# Patient Record
Sex: Female | Born: 1960
Health system: Southern US, Community
[De-identification: ages and names within clinical notes are randomized; demographics above are authoritative.]

## PROBLEM LIST (undated history)

## (undated) DIAGNOSIS — E669 Obesity, unspecified: Secondary | ICD-10-CM

## (undated) DIAGNOSIS — M7730 Calcaneal spur, unspecified foot: Secondary | ICD-10-CM

## (undated) DIAGNOSIS — E785 Hyperlipidemia, unspecified: Secondary | ICD-10-CM

## (undated) DIAGNOSIS — M7661 Achilles tendinitis, right leg: Secondary | ICD-10-CM

## (undated) DIAGNOSIS — I10 Essential (primary) hypertension: Secondary | ICD-10-CM

## (undated) DIAGNOSIS — K5790 Diverticulosis of intestine, part unspecified, without perforation or abscess without bleeding: Secondary | ICD-10-CM

## (undated) DIAGNOSIS — F17200 Nicotine dependence, unspecified, uncomplicated: Secondary | ICD-10-CM

## (undated) HISTORY — PX: CHOLECYSTECTOMY: SHX55

## (undated) HISTORY — PX: ABDOMINAL HYSTERECTOMY: SHX81

## (undated) HISTORY — DX: Essential (primary) hypertension: I10

## (undated) HISTORY — PX: BREAST BIOPSY: SHX20

---

## 2000-02-24 ENCOUNTER — Emergency Department (HOSPITAL_COMMUNITY): Admission: EM | Admit: 2000-02-24 | Discharge: 2000-02-25 | Payer: Self-pay | Admitting: Emergency Medicine

## 2000-02-25 ENCOUNTER — Encounter: Payer: Self-pay | Admitting: Emergency Medicine

## 2013-04-08 ENCOUNTER — Ambulatory Visit (INDEPENDENT_AMBULATORY_CARE_PROVIDER_SITE_OTHER): Payer: BC Managed Care – PPO

## 2013-04-08 ENCOUNTER — Encounter: Payer: Self-pay | Admitting: Podiatry

## 2013-04-08 ENCOUNTER — Ambulatory Visit (INDEPENDENT_AMBULATORY_CARE_PROVIDER_SITE_OTHER): Payer: BC Managed Care – PPO | Admitting: Podiatry

## 2013-04-08 VITALS — BP 123/80 | HR 68 | Resp 20 | Ht 68.0 in | Wt 230.0 lb

## 2013-04-08 DIAGNOSIS — R52 Pain, unspecified: Secondary | ICD-10-CM

## 2013-04-08 DIAGNOSIS — M7660 Achilles tendinitis, unspecified leg: Secondary | ICD-10-CM

## 2013-04-08 DIAGNOSIS — M722 Plantar fascial fibromatosis: Secondary | ICD-10-CM

## 2013-04-08 MED ORDER — METHYLPREDNISOLONE (PAK) 4 MG PO TABS: ORAL_TABLET | ORAL | Status: DC

## 2013-04-08 MED ORDER — MELOXICAM 15 MG PO TABS: 15.0000 mg | ORAL_TABLET | Freq: Every day | ORAL | Status: DC

## 2013-04-08 NOTE — Progress Notes (Signed)
   Subjective:    Patient ID: Ashley Bowers, female    DOB: 06-Jul-1960, 52 y.o.   MRN: [id number] "It's both feet really.  My right foot hurts on back of the heel.  My left foot swells on the top."  HPI Comments: N  Swelling, sharp pain sometime, aches L  Dorsal pain and swelling left D  August  O  Suddenly after getting cortisone shots C  Gotten worse A  Walking, standing, shoes T  [facility] took some xrays   Foot Pain This is a new (Posterior heel pain Rt.) problem. Episode onset: 1 year. The problem occurs daily. The problem has been gradually worsening. The symptoms are aggravated by walking and standing (get up in the morning). She has tried heat and NSAIDs (foot soaks, take Advil) for the symptoms. The treatment provided no relief.      Review of Systems     Objective:   Physical Exam: I have reviewed her past medical history medications and allergies as well as her family history social history and review of systems. Vital signs are stable she is alert and oriented x3. Lower extremity evaluation demonstrates strong palpable pulses bilateral +2/4 DP PT bilateral. Capillary fill time to digits one through 5 of the bilateral foot is noted to be immediate. Neurologic sensorium is intact per Semmes-Weinstein monofilament bilateral. Deep tendon reflexes are intact and brisk bilateral. Orthopedic evaluation demonstrates all joints distal to the ankle have a full range of motion without crepitation. She has no pain on palpation of the medial calcaneal tubercle right heel however of the left heel does demonstrate pain on palpation of the medial calcaneal tubercle. Neither calcaneus hurts with medial lateral compression. Cutaneous evaluation demonstrates supple well hydrated cutis no erythema edema cellulitis drainage or odor. Radiographic evaluation demonstrates a large retrocalcaneal heel spur right with soft tissue increase in density of the tendo Achilles as it inserts on the  posterior aspect of the calcaneus. Left foot demonstrates normal soft tissue margins with exception of a soft tissue increase in density at the plantar fascial calcaneal insertion site.        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 Plantar fasciitis left.  Plan: We discussed the etiology pathology conservative versus surgical therapies. I injected 2 mg of dexamethasone subcutaneously to her right posterior heel. She was then dispensed a Cam Walker for that foot. The left foot was injected at the plantar fascial calcaneal insertion site with Kenalog and local anesthetic. Plantar fascial strapping was applied and a night splint was dispensed. A prescription for Medrol Dosepak to be followed by Mobic was dispensed. We discussed in great detail the appropriate shoe gear stretching exercises and ice therapy. She was dispensed both oral and written home-going instructions for the care of Achilles tendinitis and plantar fasciitis. I will followup with her in one month.

## 2013-04-08 NOTE — Patient Instructions (Signed)
Achilles Tendinitis  with Rehab Achilles tendinitis is a disorder of the Achilles tendon. The Achilles tendon connects the large calf muscles (Gastrocnemius and Soleus) to the heel bone (calcaneus). This tendon is sometimes called the heel cord. It is important for pushing-off and standing on your toes and is important for walking, running, or jumping. Tendinitis is often caused by overuse and repetitive microtrauma. SYMPTOMS  Pain, tenderness, swelling, warmth, and redness may occur over the Achilles tendon even at rest.  Pain with pushing off, or flexing or extending the ankle.  Pain that is worsened after or during activity. CAUSES   Overuse sometimes seen with rapid increase in exercise programs or in sports requiring running and jumping.  Poor physical conditioning (strength and flexibility or endurance).  Running sports, especially training running down hills.  Inadequate warm-up before practice or play or failure to stretch before participation.  Injury to the tendon. PREVENTION   Warm up and stretch before practice or competition.  Allow time for adequate rest and recovery between practices and competition.  Keep up conditioning.  Keep up ankle and leg flexibility.  Improve or keep muscle strength and endurance.  Improve cardiovascular fitness.  Use proper technique.  Use proper equipment (shoes, skates).  To help prevent recurrence, taping, protective strapping, or an adhesive bandage may be recommended for several weeks after healing is complete. PROGNOSIS   Recovery may take weeks to several months to heal.  Longer recovery is expected if symptoms have been prolonged.  Recovery is usually quicker if the inflammation is due to a direct blow as compared with overuse or sudden strain. RELATED COMPLICATIONS   Healing time will be prolonged if the condition is not correctly treated. The injury must be given plenty of time to heal.  Symptoms can reoccur if  activity is resumed too soon.  Untreated, tendinitis may increase the risk of tendon rupture requiring additional time for recovery and possibly surgery. TREATMENT   The first treatment consists of rest anti-inflammatory medication, and ice to relieve the pain.  Stretching and strengthening exercises after resolution of pain will likely help reduce the risk of recurrence. Referral to a physical therapist or athletic trainer for further evaluation and treatment may be helpful.  A walking boot or cast may be recommended to rest the Achilles tendon. This can help break the cycle of inflammation and microtrauma.  Arch supports (orthotics) may be prescribed or recommended by your caregiver as an adjunct to therapy and rest.  Surgery to remove the inflamed tendon lining or degenerated tendon tissue is rarely necessary and has shown less than predictable results. MEDICATION   Nonsteroidal anti-inflammatory medications, such as aspirin and ibuprofen, may be used for pain and inflammation relief. Do not take within 7 days before surgery. Take these as directed by your caregiver. Contact your caregiver immediately if any bleeding, stomach upset, or signs of allergic reaction occur. Other minor pain relievers, such as acetaminophen, may also be used.  Pain relievers may be prescribed as necessary by your caregiver. Do not take prescription pain medication for longer than 4 to 7 days. Use only as directed and only as much as you need.  Cortisone injections are rarely indicated. Cortisone injections may weaken tendons and predispose to rupture. It is better to give the condition more time to heal than to use them. HEAT AND COLD  Cold is used to relieve pain and reduce inflammation for acute and chronic Achilles tendinitis. Cold should be applied for 10 to 15 minutes   every 2 to 3 hours for inflammation and pain and immediately after any activity that aggravates your symptoms. Use ice packs or an ice  massage.  Heat may be used before performing stretching and strengthening activities prescribed by your caregiver. Use a heat pack or a warm soak. SEEK MEDICAL CARE IF:  Symptoms get worse or do not improve in 2 weeks despite treatment.  New, unexplained symptoms develop. Drugs used in treatment may produce side effects. EXERCISES RANGE OF MOTION (ROM) AND STRETCHING EXERCISES - Achilles Tendinitis  These exercises may help you when beginning to rehabilitate your injury. Your symptoms may resolve with or without further involvement from your physician, physical therapist or athletic trainer. While completing these exercises, remember:   Restoring tissue flexibility helps normal motion to return to the joints. This allows healthier, less painful movement and activity.  An effective stretch should be held for at least 30 seconds.  A stretch should never be painful. You should only feel a gentle lengthening or release in the stretched tissue. STRETCH  Gastroc, Standing   Place hands on wall.  Extend right / left leg, keeping the front knee somewhat bent.  Slightly point your toes inward on your back foot.  Keeping your right / left heel on the floor and your knee straight, shift your weight toward the wall, not allowing your back to arch.  You should feel a gentle stretch in the right / left calf. Hold this position for __________ seconds. Repeat __________ times. Complete this stretch __________ times per day. STRETCH  Soleus, Standing   Place hands on wall.  Extend right / left leg, keeping the other knee somewhat bent.  Slightly point your toes inward on your back foot.  Keep your right / left heel on the floor, bend your back knee, and slightly shift your weight over the back leg so that you feel a gentle stretch deep in your back calf.  Hold this position for __________ seconds. Repeat __________ times. Complete this stretch __________ times per day. STRETCH  Gastrocsoleus,  Standing  Note: This exercise can place a lot of stress on your foot and ankle. Please complete this exercise only if specifically instructed by your caregiver.   Place the ball of your right / left foot on a step, keeping your other foot firmly on the same step.  Hold on to the wall or a rail for balance.  Slowly lift your other foot, allowing your body weight to press your heel down over the edge of the step.  You should feel a stretch in your right / left calf.  Hold this position for __________ seconds.  Repeat this exercise with a slight bend in your knee. Repeat __________ times. Complete this stretch __________ times per day.  STRENGTHENING EXERCISES - Achilles Tendinitis These exercises may help you when beginning to rehabilitate your injury. They may resolve your symptoms with or without further involvement from your physician, physical therapist or athletic trainer. While completing these exercises, remember:   Muscles can gain both the endurance and the strength needed for everyday activities through controlled exercises.  Complete these exercises as instructed by your physician, physical therapist or athletic trainer. Progress the resistance and repetitions only as guided.  You may experience muscle soreness or fatigue, but the pain or discomfort you are trying to eliminate should never worsen during these exercises. If this pain does worsen, stop and make certain you are following the directions exactly. If the pain is still present after   adjustments, discontinue the exercise until you can discuss the trouble with your clinician. STRENGTH - Plantar-flexors   Sit with your right / left leg extended. Holding onto both ends of a rubber exercise band/tubing, loop it around the ball of your foot. Keep a slight tension in the band.  Slowly push your toes away from you, pointing them downward.  Hold this position for __________ seconds. Return slowly, controlling the tension in the  band/tubing. Repeat __________ times. Complete this exercise __________ times per day.  STRENGTH - Plantar-flexors   Stand with your feet shoulder width apart. Steady yourself with a wall or table using as little support as needed.  Keeping your weight evenly spread over the width of your feet, rise up on your toes.*  Hold this position for __________ seconds. Repeat __________ times. Complete this exercise __________ times per day.  *If this is too easy, shift your weight toward your right / left leg until you feel challenged. Ultimately, you may be asked to do this exercise with your right / left foot only. STRENGTH  Plantar-flexors, Eccentric  Note: This exercise can place a lot of stress on your foot and ankle. Please complete this exercise only if specifically instructed by your caregiver.   Place the balls of your feet on a step. With your hands, use only enough support from a wall or rail to keep your balance.  Keep your knees straight and rise up on your toes.  Slowly shift your weight entirely to your right / left toes and pick up your opposite foot. Gently and with controlled movement, lower your weight through your right / left foot so that your heel drops below the level of the step. You will feel a slight stretch in the back of your calf at the end position.  Use the healthy leg to help rise up onto the balls of both feet, then lower weight only on the right / left leg again. Build up to 15 repetitions. Then progress to 3 consecutive sets of 15 repetitions.*  After completing the above exercise, complete the same exercise with a slight knee bend (about 30 degrees). Again, build up to 15 repetitions. Then progress to 3 consecutive sets of 15 repetitions.* Perform this exercise __________ times per day.  *When you easily complete 3 sets of 15, your physician, physical therapist or athletic trainer may advise you to add resistance by wearing a backpack filled with additional  weight. STRENGTH - Plantar Flexors, Seated   Sit on a chair that allows your feet to rest flat on the ground. If necessary, sit at the edge of the chair.  Keeping your toes firmly on the ground, lift your right / left heel as far as you can without increasing any discomfort in your ankle. Repeat __________ times. Complete this exercise __________ times a day. *If instructed by your physician, physical therapist or athletic trainer, you may add ____________________ of resistance by placing a weighted object on your right / left knee. Document Released: 11/21/2004 Document Revised: 07/15/2011 Document Reviewed: 08/04/2008 [employer] Patient Information 2014 [employer]. Achilles Tendinitis Tendinitis a swelling and soreness of the tendon. The pain in the tendon (cord-like structure which attaches muscle to bone) is produced by tiny tears and the inflammation present in that tendon. It commonly occurs at the shoulders, heels, and elbows. It is usually caused by overusing the tendon and joint involved. Achilles tendinitis involves the Achilles tendon. This is the large tendon in the back of the leg just   above the foot. It attaches the large muscles of the lower leg to the heel bone (called calcaneus).  This diagnosis (learning what is wrong) is made by examination. X-rays will be generally be normal if only tendinitis is present. HOME CARE INSTRUCTIONS   Apply ice to the injury for 15-20 minutes, 03-04 times per day. Put the ice in a plastic bag and place a towel between the bag of ice and your skin.  Try to avoid use other than gentle range of motion while the tendon is painful. Do not resume use until instructed by your caregiver. Then begin use gradually. Do not increase use to the point of pain. If pain does develop, decrease use and continue the above measures. Gradually increase activities that do not cause discomfort until you gradually achieve normal use.  Only take over-the-counter or  prescription medicines for pain, discomfort, or fever as directed by your caregiver. SEEK MEDICAL CARE IF:   Your pain and swelling increase or pain is uncontrolled with medications.  You develop new, unexplained problems (symptoms) or an increase of the symptoms that brought you to your caregiver.  You develop an inability to move your toes or foot, develop warmth and swelling in your foot, or begin running an unexplained temperature. MAKE SURE YOU:   Understand these instructions.  Will watch your condition.  Will get help right away if you are not doing well or get worse. Document Released: 01/30/2005 Document Revised: 07/15/2011 Document Reviewed: 12/02/2012 [employer] Patient Information 2014 [employer]. Plantar Fasciitis (Heel Spur Syndrome) with Rehab The plantar fascia is a fibrous, ligament-like, soft-tissue structure that spans the bottom of the foot. Plantar fasciitis is a condition that causes pain in the foot due to inflammation of the tissue. SYMPTOMS   Pain and tenderness on the underneath side of the foot.  Pain that worsens with standing or walking. CAUSES  Plantar fasciitis is caused by irritation and injury to the plantar fascia on the underneath side of the foot. Common mechanisms of injury include:  Direct trauma to bottom of the foot.  Damage to a small nerve that runs under the foot where the main fascia attaches to the heel bone.  Stress placed on the plantar fascia due to bone spurs. RISK INCREASES WITH:   Activities that place stress on the plantar fascia (running, jumping, pivoting, or cutting).  Poor strength and flexibility.  Improperly fitted shoes.  Tight calf muscles.  Flat feet.  Failure to warm-up properly before activity.  Obesity. PREVENTION  Warm up and stretch properly before activity.  Allow for adequate recovery between workouts.  Maintain physical fitness:  Strength, flexibility, and endurance.  Cardiovascular  fitness.  Maintain a health body weight.  Avoid stress on the plantar fascia.  Wear properly fitted shoes, including arch supports for individuals who have flat feet. PROGNOSIS  If treated properly, then the symptoms of plantar fasciitis usually resolve without surgery. However, occasionally surgery is necessary. RELATED COMPLICATIONS   Recurrent symptoms that may result in a chronic condition.  Problems of the lower back that are caused by compensating for the injury, such as limping.  Pain or weakness of the foot during push-off following surgery.  Chronic inflammation, scarring, and partial or complete fascia tear, occurring more often from repeated injections. TREATMENT  Treatment initially involves the use of ice and medication to help reduce pain and inflammation. The use of strengthening and stretching exercises may help reduce pain with activity, especially stretches of the Achilles tendon. These exercises   may be performed at home or with a therapist. Your caregiver may recommend that you use heel cups of arch supports to help reduce stress on the plantar fascia. Occasionally, corticosteroid injections are given to reduce inflammation. If symptoms persist for greater than 6 months despite non-surgical (conservative), then surgery may be recommended.  MEDICATION   If pain medication is necessary, then nonsteroidal anti-inflammatory medications, such as aspirin and ibuprofen, or other minor pain relievers, such as acetaminophen, are often recommended.  Do not take pain medication within 7 days before surgery.  Prescription pain relievers may be given if deemed necessary by your caregiver. Use only as directed and only as much as you need.  Corticosteroid injections may be given by your caregiver. These injections should be reserved for the most serious cases, because they may only be given a certain number of times. HEAT AND COLD  Cold treatment (icing) relieves pain and reduces  inflammation. Cold treatment should be applied for 10 to 15 minutes every 2 to 3 hours for inflammation and pain and immediately after any activity that aggravates your symptoms. Use ice packs or massage the area with a piece of ice (ice massage).  Heat treatment may be used prior to performing the stretching and strengthening activities prescribed by your caregiver, physical therapist, or athletic trainer. Use a heat pack or soak the injury in warm water. SEEK IMMEDIATE MEDICAL CARE IF:  Treatment seems to offer no benefit, or the condition worsens.  Any medications produce adverse side effects. EXERCISES RANGE OF MOTION (ROM) AND STRETCHING EXERCISES - Plantar Fasciitis (Heel Spur Syndrome) These exercises may help you when beginning to rehabilitate your injury. Your symptoms may resolve with or without further involvement from your physician, physical therapist or athletic trainer. While completing these exercises, remember:   Restoring tissue flexibility helps normal motion to return to the joints. This allows healthier, less painful movement and activity.  An effective stretch should be held for at least 30 seconds.  A stretch should never be painful. You should only feel a gentle lengthening or release in the stretched tissue. RANGE OF MOTION - Toe Extension, Flexion  Sit with your right / left leg crossed over your opposite knee.  Grasp your toes and gently pull them back toward the top of your foot. You should feel a stretch on the bottom of your toes and/or foot.  Hold this stretch for __________ seconds.  Now, gently pull your toes toward the bottom of your foot. You should feel a stretch on the top of your toes and or foot.  Hold this stretch for __________ seconds. Repeat __________ times. Complete this stretch __________ times per day.  RANGE OF MOTION - Ankle Dorsiflexion, Active Assisted  Remove shoes and sit on a chair that is preferably not on a carpeted  surface.  Place right / left foot under knee. Extend your opposite leg for support.  Keeping your heel down, slide your right / left foot back toward the chair until you feel a stretch at your ankle or calf. If you do not feel a stretch, slide your bottom forward to the edge of the chair, while still keeping your heel down.  Hold this stretch for __________ seconds. Repeat __________ times. Complete this stretch __________ times per day.  STRETCH  Gastroc, Standing  Place hands on wall.  Extend right / left leg, keeping the front knee somewhat bent.  Slightly point your toes inward on your back foot.  Keeping your right / left   heel on the floor and your knee straight, shift your weight toward the wall, not allowing your back to arch.  You should feel a gentle stretch in the right / left calf. Hold this position for __________ seconds. Repeat __________ times. Complete this stretch __________ times per day. STRETCH  Soleus, Standing  Place hands on wall.  Extend right / left leg, keeping the other knee somewhat bent.  Slightly point your toes inward on your back foot.  Keep your right / left heel on the floor, bend your back knee, and slightly shift your weight over the back leg so that you feel a gentle stretch deep in your back calf.  Hold this position for __________ seconds. Repeat __________ times. Complete this stretch __________ times per day. STRETCH  Gastrocsoleus, Standing  Note: This exercise can place a lot of stress on your foot and ankle. Please complete this exercise only if specifically instructed by your caregiver.   Place the ball of your right / left foot on a step, keeping your other foot firmly on the same step.  Hold on to the wall or a rail for balance.  Slowly lift your other foot, allowing your body weight to press your heel down over the edge of the step.  You should feel a stretch in your right / left calf.  Hold this position for __________  seconds.  Repeat this exercise with a slight bend in your right / left knee. Repeat __________ times. Complete this stretch __________ times per day.  STRENGTHENING EXERCISES - Plantar Fasciitis (Heel Spur Syndrome)  These exercises may help you when beginning to rehabilitate your injury. They may resolve your symptoms with or without further involvement from your physician, physical therapist or athletic trainer. While completing these exercises, remember:   Muscles can gain both the endurance and the strength needed for everyday activities through controlled exercises.  Complete these exercises as instructed by your physician, physical therapist or athletic trainer. Progress the resistance and repetitions only as guided. STRENGTH - Towel Curls  Sit in a chair positioned on a non-carpeted surface.  Place your foot on a towel, keeping your heel on the floor.  Pull the towel toward your heel by only curling your toes. Keep your heel on the floor.  If instructed by your physician, physical therapist or athletic trainer, add ____________________ at the end of the towel. Repeat __________ times. Complete this exercise __________ times per day. STRENGTH - Ankle Inversion  Secure one end of a rubber exercise band/tubing to a fixed object (table, pole). Loop the other end around your foot just before your toes.  Place your fists between your knees. This will focus your strengthening at your ankle.  Slowly, pull your big toe up and in, making sure the band/tubing is positioned to resist the entire motion.  Hold this position for __________ seconds.  Have your muscles resist the band/tubing as it slowly pulls your foot back to the starting position. Repeat __________ times. Complete this exercises __________ times per day.  Document Released: 04/22/2005 Document Revised: 07/15/2011 Document Reviewed: 08/04/2008 [employer] Patient Information 2014 [employer]. Plantar Fasciitis Plantar  fasciitis is a common condition that causes foot pain. It is soreness (inflammation) of the band of tough fibrous tissue on the bottom of the foot that runs from the heel bone (calcaneus) to the ball of the foot. The cause of this soreness may be from excessive standing, poor fitting shoes, running on hard surfaces, being overweight, having an abnormal   walk, or overuse (this is common in runners) of the painful foot or feet. It is also common in aerobic exercise dancers and ballet dancers. SYMPTOMS  Most people with plantar fasciitis complain of:  Severe pain in the morning on the bottom of their foot especially when taking the first steps out of bed. This pain recedes after a few minutes of walking.  Severe pain is experienced also during walking following a long period of inactivity.  Pain is worse when walking barefoot or up stairs DIAGNOSIS   Your caregiver will diagnose this condition by examining and feeling your foot.  Special tests such as X-rays of your foot, are usually not needed. PREVENTION   Consult a sports medicine professional before beginning a new exercise program.  Walking programs offer a good workout. With walking there is a lower chance of overuse injuries common to runners. There is less impact and less jarring of the joints.  Begin all new exercise programs slowly. If problems or pain develop, decrease the amount of time or distance until you are at a comfortable level.  Wear good shoes and replace them regularly.  Stretch your foot and the heel cords at the back of the ankle (Achilles tendon) both before and after exercise.  Run or exercise on even surfaces that are not hard. For example, asphalt is better than pavement.  Do not run barefoot on hard surfaces.  If using a treadmill, vary the incline.  Do not continue to workout if you have foot or joint problems. Seek professional help if they do not improve. HOME CARE INSTRUCTIONS   Avoid activities that  cause you pain until you recover.  Use ice or cold packs on the problem or painful areas after working out.  Only take over-the-counter or prescription medicines for pain, discomfort, or fever as directed by your caregiver.  Soft shoe inserts or athletic shoes with air or gel sole cushions may be helpful.  If problems continue or become more severe, consult a sports medicine caregiver or your own health care provider. Cortisone is a potent anti-inflammatory medication that may be injected into the painful area. You can discuss this treatment with your caregiver. MAKE SURE YOU:   Understand these instructions.  Will watch your condition.  Will get help right away if you are not doing well or get worse. Document Released: 01/15/2001 Document Revised: 07/15/2011 Document Reviewed: 03/16/2008 [employer] Patient Information 2014 [employer].  

## 2013-05-11 ENCOUNTER — Ambulatory Visit (INDEPENDENT_AMBULATORY_CARE_PROVIDER_SITE_OTHER): Payer: BC Managed Care – PPO | Admitting: Podiatry

## 2013-05-11 ENCOUNTER — Encounter: Payer: Self-pay | Admitting: Podiatry

## 2013-05-11 VITALS — BP 130/83 | HR 73 | Resp 18

## 2013-05-11 DIAGNOSIS — M7660 Achilles tendinitis, unspecified leg: Secondary | ICD-10-CM

## 2013-05-11 NOTE — Progress Notes (Signed)
She presents today stating that her plantar fasciitis to the left foot has resolved however she still has some tenderness to the posterior aspect of the tendo Achilles at its insertion site on the right heel. She states that the Cam Walker culture back to be painful and was unable to utilize it. She does wear tennis shoes in the house. She continues her anti-inflammatories and ice.  Objective: Vital signs are stable she is alert and oriented x3. No pain on palpation medial calcaneal tubercle of the left heel as previously noted. She does have tenderness on palpation with a palpable bursitis posterior lateral aspect of the right calcaneus.  Assessment: Well-healing plantar fasciitis left. Bursitis tendo Achilles tendinitis right heel.  Plan: Discussed the etiology pathology conservative versus surgical therapies. At this point I reinjected subcutaneously the superficial bursa with dexamethasone, 2 mg into the bursa. We discussed the possible need for physical therapy after next visit. She will continue all conservative therapies and even sleep with the Cam Walker.

## 2013-06-08 ENCOUNTER — Ambulatory Visit (INDEPENDENT_AMBULATORY_CARE_PROVIDER_SITE_OTHER): Payer: BC Managed Care – PPO | Admitting: Podiatry

## 2013-06-08 ENCOUNTER — Encounter: Payer: Self-pay | Admitting: Podiatry

## 2013-06-08 VITALS — BP 135/86 | HR 78 | Resp 16

## 2013-06-08 DIAGNOSIS — M7730 Calcaneal spur, unspecified foot: Secondary | ICD-10-CM

## 2013-06-08 DIAGNOSIS — M7660 Achilles tendinitis, unspecified leg: Secondary | ICD-10-CM

## 2013-06-08 NOTE — Patient Instructions (Signed)
Pre-Operative Instructions  Congratulations, you have decided to take an important step to improving your quality of life.  You can be assured that the doctors of [facility] will be with you every step of the way.  1. Plan to be at the surgery center/hospital at least 1 (one) hour prior to your scheduled time unless otherwise directed by the surgical center/hospital staff.  You must have a responsible adult accompany you, remain during the surgery and drive you home.  Make sure you have directions to the surgical center/hospital and know how to get there on time. 2. For hospital based surgery you will need to obtain a history and physical form from your family physician within 1 month prior to the date of surgery- we will give you a form for you primary physician.  3. We make every effort to accommodate the date you request for surgery.  There are however, times where surgery dates or times have to be moved.  We will contact you as soon as possible if a change in schedule is required.   4. No Aspirin/Ibuprofen for one week before surgery.  If you are on aspirin, any non-steroidal anti-inflammatory medications (Mobic, Aleve, Ibuprofen) you should stop taking it 7 days prior to your surgery.  You make take Tylenol  For pain prior to surgery.  5. Medications- If you are taking daily heart and blood pressure medications, seizure, reflux, allergy, asthma, anxiety, pain or diabetes medications, make sure the surgery center/hospital is aware before the day of surgery so they may notify you which medications to take or avoid the day of surgery. 6. No food or drink after midnight the night before surgery unless directed otherwise by surgical center/hospital staff. 7. No alcoholic beverages 24 hours prior to surgery.  No smoking 24 hours prior to or 24 hours after surgery. 8. Wear loose pants or shorts- loose enough to fit over bandages, boots, and casts. 9. No slip on shoes, sneakers are best. 10. Bring  your boot with you to the surgery center/hospital.  Also bring crutches or a walker if your physician has prescribed it for you.  If you do not have this equipment, it will be provided for you after surgery. 11. If you have not been contracted by the surgery center/hospital by the day before your surgery, call to confirm the date and time of your surgery. 12. Leave-time from work may vary depending on the type of surgery you have.  Appropriate arrangements should be made prior to surgery with your employer. 13. Prescriptions will be provided immediately following surgery by your doctor.  Have these filled as soon as possible after surgery and take the medication as directed. 14. Remove nail polish on the operative foot. 15. Wash the night before surgery.  The night before surgery wash the foot and leg well with the antibacterial soap provided and water paying special attention to beneath the toenails and in between the toes.  Rinse thoroughly with water and dry well with a towel.  Perform this wash unless told not to do so by your physician.  Enclosed: 1 Ice pack (please put in freezer the night before surgery)   1 Hibiclens skin cleaner   Pre-op Instructions  If you have any questions regarding the instructions, do not hesitate to call our office.  [place]: [address] [place], [place] [address] [phone]  [place]: [address] [place], [place] [address] [phone]  [place]: [address]  [place],  [address] [phone]  Dr. Richard   Tuchman DPM, Dr. Norman Regal DPM Dr. Richard Sikora DPM, Dr. M. Todd Hyatt DPM, Dr. Kathryn Egerton DPM 

## 2013-06-08 NOTE — Progress Notes (Signed)
Ashley Bowers presents today for followup of her retrocalcaneal heel spur and her tendo Achilles tendinitis. She states that I am just ready to have surgery done. I'm tired awaiting and I have gotten no better.  Objective: Vital signs are stable she is alert and oriented x3. She has pain on palpation of the posterior right heel. Pulses are strongly palpable right.  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  Plan: We discussed the etiology pathology conservative versus surgical therapies. At this point we signed all 3 pages of the consent form for a retrocalcaneal heel spur resection and Achilles tendon lysis and endoscopic plantar fasciotomy and and application of a below-knee cast. I answered all the questions regarding these procedures to the best of my ability in layman's terms. She understood this was amenable to it and I will followup with her in the near future for surgery.

## 2013-07-02 ENCOUNTER — Emergency Department (HOSPITAL_COMMUNITY): Payer: BC Managed Care – PPO

## 2013-07-02 ENCOUNTER — Inpatient Hospital Stay (HOSPITAL_COMMUNITY)
Admission: EM | Admit: 2013-07-02 | Discharge: 2013-07-05 | DRG: 287 | Disposition: A | Discharge status: Home / Self Care | Payer: BC Managed Care – PPO | Attending: Internal Medicine | Admitting: Internal Medicine

## 2013-07-02 ENCOUNTER — Encounter: Payer: Self-pay | Admitting: Podiatry

## 2013-07-02 ENCOUNTER — Encounter (HOSPITAL_COMMUNITY): Payer: Self-pay | Admitting: Emergency Medicine

## 2013-07-02 DIAGNOSIS — E785 Hyperlipidemia, unspecified: Secondary | ICD-10-CM | POA: Diagnosis present

## 2013-07-02 DIAGNOSIS — F17200 Nicotine dependence, unspecified, uncomplicated: Secondary | ICD-10-CM | POA: Diagnosis present

## 2013-07-02 DIAGNOSIS — E669 Obesity, unspecified: Secondary | ICD-10-CM | POA: Diagnosis present

## 2013-07-02 DIAGNOSIS — R0789 Other chest pain: Principal | ICD-10-CM | POA: Diagnosis present

## 2013-07-02 DIAGNOSIS — Z538 Procedure and treatment not carried out for other reasons: Secondary | ICD-10-CM

## 2013-07-02 DIAGNOSIS — M7661 Achilles tendinitis, right leg: Secondary | ICD-10-CM

## 2013-07-02 DIAGNOSIS — Z7982 Long term (current) use of aspirin: Secondary | ICD-10-CM

## 2013-07-02 DIAGNOSIS — M7730 Calcaneal spur, unspecified foot: Secondary | ICD-10-CM | POA: Diagnosis present

## 2013-07-02 DIAGNOSIS — R079 Chest pain, unspecified: Secondary | ICD-10-CM

## 2013-07-02 DIAGNOSIS — Z79899 Other long term (current) drug therapy: Secondary | ICD-10-CM

## 2013-07-02 DIAGNOSIS — I10 Essential (primary) hypertension: Secondary | ICD-10-CM | POA: Diagnosis present

## 2013-07-02 DIAGNOSIS — M7660 Achilles tendinitis, unspecified leg: Secondary | ICD-10-CM

## 2013-07-02 DIAGNOSIS — I959 Hypotension, unspecified: Secondary | ICD-10-CM | POA: Diagnosis present

## 2013-07-02 DIAGNOSIS — R9439 Abnormal result of other cardiovascular function study: Secondary | ICD-10-CM

## 2013-07-02 DIAGNOSIS — G4730 Sleep apnea, unspecified: Secondary | ICD-10-CM | POA: Diagnosis present

## 2013-07-02 DIAGNOSIS — I517 Cardiomegaly: Secondary | ICD-10-CM

## 2013-07-02 DIAGNOSIS — M722 Plantar fascial fibromatosis: Secondary | ICD-10-CM

## 2013-07-02 DIAGNOSIS — Z8249 Family history of ischemic heart disease and other diseases of the circulatory system: Secondary | ICD-10-CM

## 2013-07-02 DIAGNOSIS — Q664 Congenital talipes calcaneovalgus, unspecified foot: Secondary | ICD-10-CM

## 2013-07-02 HISTORY — DX: Nicotine dependence, unspecified, uncomplicated: F17.200

## 2013-07-02 HISTORY — DX: Hyperlipidemia, unspecified: E78.5

## 2013-07-02 HISTORY — DX: Diverticulosis of intestine, part unspecified, without perforation or abscess without bleeding: K57.90

## 2013-07-02 HISTORY — DX: Obesity, unspecified: E66.9

## 2013-07-02 HISTORY — DX: Achilles tendinitis, right leg: M76.61

## 2013-07-02 HISTORY — DX: Calcaneal spur, unspecified foot: M77.30

## 2013-07-02 LAB — I-STAT TROPONIN, ED: Troponin i, poc: 0.01 ng/mL (ref 0.00–0.08)

## 2013-07-02 LAB — PROTIME-INR
INR: 0.97 (ref 0.00–1.49)
PROTHROMBIN TIME: 12.7 s (ref 11.6–15.2)

## 2013-07-02 LAB — COMPREHENSIVE METABOLIC PANEL
ALBUMIN: 3.1 g/dL — AB (ref 3.5–5.2)
ALT: 12 U/L (ref 0–35)
AST: 14 U/L (ref 0–37)
Alkaline Phosphatase: 58 U/L (ref 39–117)
BILIRUBIN TOTAL: 0.2 mg/dL — AB (ref 0.3–1.2)
BUN: 14 mg/dL (ref 6–23)
CHLORIDE: 105 meq/L (ref 96–112)
CO2: 22 meq/L (ref 19–32)
Calcium: 8.5 mg/dL (ref 8.4–10.5)
Creatinine, Ser: 0.69 mg/dL (ref 0.50–1.10)
GFR calc Af Amer: >90 mL/min (ref >90)
Glucose, Bld: 99 mg/dL (ref 70–99)
Potassium: 4.2 mEq/L (ref 3.7–5.3)
SODIUM: 138 meq/L (ref 137–147)
Total Protein: 6.1 g/dL (ref 6.0–8.3)

## 2013-07-02 LAB — HEMOGLOBIN A1C
Hgb A1c MFr Bld: 5.5 % (ref <5.7)
Mean Plasma Glucose: 111 mg/dL (ref <117)

## 2013-07-02 LAB — CBC
HCT: 39.3 % (ref 36.0–46.0)
HCT: 38.4 % (ref 36.0–46.0)
Hemoglobin: 13 g/dL (ref 12.0–15.0)
Hemoglobin: 13 g/dL (ref 12.0–15.0)
MCH: 31 pg (ref 26.0–34.0)
MCH: 31.6 pg (ref 26.0–34.0)
MCHC: 33.1 g/dL (ref 30.0–36.0)
MCHC: 33.9 g/dL (ref 30.0–36.0)
MCV: 93.6 fL (ref 78.0–100.0)
MCV: 93.2 fL (ref 78.0–100.0)
PLATELETS: 211 10*3/uL (ref 150–400)
Platelets: 217 10*3/uL (ref 150–400)
RBC: 4.2 MIL/uL (ref 3.87–5.11)
RBC: 4.12 MIL/uL (ref 3.87–5.11)
RDW: 13.8 % (ref 11.5–15.5)
RDW: 13.8 % (ref 11.5–15.5)
WBC: 13 10*3/uL — AB (ref 4.0–10.5)
WBC: 11.6 10*3/uL — ABNORMAL HIGH (ref 4.0–10.5)

## 2013-07-02 LAB — URINALYSIS, ROUTINE W REFLEX MICROSCOPIC
Bilirubin Urine: NEGATIVE
GLUCOSE, UA: NEGATIVE mg/dL
Hgb urine dipstick: NEGATIVE
KETONES UR: NEGATIVE mg/dL
Leukocytes, UA: NEGATIVE
Nitrite: NEGATIVE
Protein, ur: NEGATIVE mg/dL
SPECIFIC GRAVITY, URINE: 1.014 (ref 1.005–1.030)
Urobilinogen, UA: 0.2 mg/dL (ref 0.0–1.0)
pH: 6 (ref 5.0–8.0)

## 2013-07-02 LAB — CREATININE, SERUM
Creatinine, Ser: 0.66 mg/dL (ref 0.50–1.10)
GFR calc Af Amer: >90 mL/min (ref >90)
GFR calc non Af Amer: >90 mL/min (ref >90)

## 2013-07-02 LAB — MAGNESIUM: Magnesium: 2 mg/dL (ref 1.5–2.5)

## 2013-07-02 LAB — TROPONIN I: Troponin I: <0.3 ng/mL (ref <0.30)

## 2013-07-02 LAB — TSH: TSH: 0.825 u[IU]/mL (ref 0.350–4.500)

## 2013-07-02 LAB — APTT: APTT: 32 s (ref 24–37)

## 2013-07-02 MED ORDER — OXYCODONE-ACETAMINOPHEN 5-325 MG PO TABS
1.0000 | ORAL_TABLET | ORAL | Status: DC | PRN
  Administered 2013-07-02: 2 via ORAL
  Administered 2013-07-03 (×2): 1 via ORAL
  Administered 2013-07-03: 2 via ORAL
  Administered 2013-07-03: 1 via ORAL
  Administered 2013-07-03 – 2013-07-04 (×3): 2 via ORAL
  Administered 2013-07-04: 1 via ORAL
  Filled 2013-07-02 (×2): qty 2
  Filled 2013-07-02: qty 1
  Filled 2013-07-02 (×2): qty 2
  Filled 2013-07-02: qty 1
  Filled 2013-07-02: qty 2
  Filled 2013-07-02 (×2): qty 1

## 2013-07-02 MED ORDER — SODIUM CHLORIDE 0.9 % IV SOLN: INTRAVENOUS | Status: AC

## 2013-07-02 MED ORDER — HYDROMORPHONE HCL PF 1 MG/ML IJ SOLN: 0.5000 mg | INTRAMUSCULAR | Status: AC | PRN

## 2013-07-02 MED ORDER — ONDANSETRON HCL 4 MG/2ML IJ SOLN: 4.0000 mg | Freq: Three times a day (TID) | INTRAMUSCULAR | Status: AC | PRN

## 2013-07-02 MED ORDER — NITROGLYCERIN 0.4 MG SL SUBL: 0.4000 mg | SUBLINGUAL_TABLET | SUBLINGUAL | Status: DC | PRN

## 2013-07-02 MED ORDER — SIMVASTATIN 20 MG PO TABS
20.0000 mg | ORAL_TABLET | Freq: Every day | ORAL | Status: DC
  Administered 2013-07-02 – 2013-07-04 (×3): 20 mg via ORAL
  Filled 2013-07-02 (×4): qty 1

## 2013-07-02 MED ORDER — ASPIRIN 81 MG PO CHEW: 324.0000 mg | CHEWABLE_TABLET | ORAL | Status: AC

## 2013-07-02 MED ORDER — ASPIRIN EC 81 MG PO TBEC
81.0000 mg | DELAYED_RELEASE_TABLET | Freq: Every day | ORAL | Status: DC
  Administered 2013-07-03 – 2013-07-04 (×2): 81 mg via ORAL
  Filled 2013-07-02 (×3): qty 1

## 2013-07-02 MED ORDER — NICOTINE 21 MG/24HR TD PT24
21.0000 mg | MEDICATED_PATCH | Freq: Every day | TRANSDERMAL | Status: DC
  Filled 2013-07-02 (×4): qty 1

## 2013-07-02 MED ORDER — HYDROMORPHONE HCL PF 1 MG/ML IJ SOLN: 1.0000 mg | INTRAMUSCULAR | Status: DC | PRN

## 2013-07-02 MED ORDER — ENOXAPARIN SODIUM 40 MG/0.4ML ~~LOC~~ SOLN
40.0000 mg | SUBCUTANEOUS | Status: DC
  Administered 2013-07-03 – 2013-07-04 (×2): 40 mg via SUBCUTANEOUS
  Filled 2013-07-02 (×3): qty 0.4

## 2013-07-02 MED ORDER — CLINDAMYCIN HCL 150 MG PO CAPS
150.0000 mg | ORAL_CAPSULE | Freq: Three times a day (TID) | ORAL | Status: DC
  Administered 2013-07-02 – 2013-07-05 (×8): 150 mg via ORAL
  Filled 2013-07-02 (×11): qty 1

## 2013-07-02 MED ORDER — ASPIRIN 300 MG RE SUPP
300.0000 mg | RECTAL | Status: AC
Start: 2013-07-02 — End: 2013-07-03
  Filled 2013-07-02: qty 1

## 2013-07-02 MED ORDER — CARVEDILOL 3.125 MG PO TABS
3.1250 mg | ORAL_TABLET | Freq: Two times a day (BID) | ORAL | Status: DC
Start: 2013-07-02 — End: 2013-07-05
  Administered 2013-07-03 – 2013-07-04 (×3): 3.125 mg via ORAL
  Filled 2013-07-02 (×8): qty 1

## 2013-07-02 NOTE — H&P (Signed)
Cardiology Consultation Note  Patient ID: Ashley Bowers, MRN: [id number], DOB/AGE: 51/07/1960 51 y.o. Admit date: 07/02/2013   Date of Consult: 07/02/2013 Primary Physician: Dema SeverinYORK,REGINA F, NP Primary Cardiologist: New to Dr. Tenny Crawoss  Chief Complaint: CP Reason for Consult: CP  HPI: Ms. Ashley Bowers is a 53 y/o F with history of HTN, HLD, 35 yrs of tobacco abuse, family history CAD (father-CABG ~age 51) who presents to [facility] today with an episode of chest pain following foot surgery. She underwent heel spur resection and tendon surgery for achilles tendinopathy with endoscopic plantar fasciotomy and application of below the knee cast. She awoke from anesthesia and remembers hearing the nurse say her blood pressure was dropping. She developed substernal 10/10 pressure-like chest pain with associated tingling in her left arm, nausea, and dyspnea. She was given NTG with improvement in CP to 5/10 as well as 4 baby aspirins total. Per report, blood pressure decreased to 80 after NTG but had come up by the time she arrived at the ER. Chest pain has gradually decreased but she can still feel a mild sensation that it's there. Pain is worse with application of palpation to the chest wall. No increased pain with inspiration. She works at [employer] and walks up and down stairs frequently throughout the day for her job without recent CP or SOB. However, she has felt more tired lately. She has had a lot of stress related to preparing for the foot surgery. Her fiance recalls the patient telling him early one morning about a month ago that she had had chest pain in the middle of the night with SOB. She told him she thought about calling 911 but didn't. The patient doesn't remember telling him this.  Troponins neg x 2 thus far, WBC 13k otherwise labs unremarkable. CXR without acute disease and EKG nonspecific changes.  Past Medical History  Diagnosis Date  . Hypertension   . Hyperlipidemia   . Obesity,  unspecified   . Tobacco use disorder   . Achilles tendinitis of right lower extremity   . Heel spur: RETROCALCANEAL HEEL SPUR   . Diverticulosis       Most Recent Cardiac Studies: None   Surgical History:  Past Surgical History  Procedure Laterality Date  . Abdominal hysterectomy    . Cholecystectomy    . Breast biopsy Left      Home Meds: Prior to Admission medications   Medication Sig Start Date End Date Taking? Authorizing Provider  amLODipine-benazepril (LOTREL) 10-20 MG per capsule Take 1 capsule by mouth daily.   Yes Historical Provider, MD  hydrochlorothiazide (MICROZIDE) 12.5 MG capsule Take 12.5 mg by mouth daily.   Yes Historical Provider, MD    Inpatient Medications:  . aspirin  324 mg Oral NOW   Or  . aspirin  300 mg Rectal NOW  . [START ON 07/03/2013] aspirin EC  81 mg Oral Daily  . carvedilol  3.125 mg Oral BID WC  . enoxaparin (LOVENOX) injection  40 mg Subcutaneous Q24H  . nicotine  21 mg Transdermal Daily  . simvastatin  20 mg Oral q1800   . sodium chloride      Allergies:  Allergies  Allergen Reactions  . Penicillins Anaphylaxis    Throat swelling, can't breathe  . Morphine And Related     Itching. But has taken Percocet and Dilaudid without adverse reaction. Vicodin causes nausea.  . Vicodin [Hydrocodone-Acetaminophen] Nausea And Vomiting    But has taken Percocet and Dilaudid without adverse  reaction.    History   Social History  . Marital Status: Legally Separated    Spouse Name: N/A    Number of Children: N/A  . Years of Education: N/A   Occupational History  .      Retail at [employer]   Social History Main Topics  . Smoking status: Current Every Day Smoker -- 0.50 packs/day for 35 years    Types: Cigarettes  . Smokeless tobacco: Never Used  . Alcohol Use: No  . Drug Use: No  . Sexual Activity: Not on file   Other Topics Concern  . Not on file   Social History Narrative  . No narrative on file     Family History    Problem Relation Age of Onset  . Heart disease Father     CABG ~age 66  . Heart disease Mother     "Spasmodic artery"     Review of Systems: General: negative for chills, fever, night sweats Cardiovascular: see above. No LEE, orthopnea, palpitations, syncope Dermatological: negative for rash Respiratory: negative for cough or wheezing Urologic: negative for hematuria Abdominal: negative for diarrhea, bright red blood per rectum, melena, or hematemesis. Fiance said she has thin stools but patient denied this. Neurologic: negative for visual changes, syncope, or dizziness All other systems reviewed and are otherwise negative except as noted above.  Labs:  Recent Labs  07/02/13 1452  TROPONINI <0.30   Lab Results  Component Value Date   WBC 13.0* 07/02/2013   HGB 13.0 07/02/2013   HCT 39.3 07/02/2013   MCV 93.6 07/02/2013   PLT 211 07/02/2013     Recent Labs Lab 07/02/13 1230  NA 138  K 4.2  CL 105  CO2 22  BUN 14  CREATININE 0.69  CALCIUM 8.5  PROT 6.1  BILITOT 0.2*  ALKPHOS 58  ALT 12  AST 14  GLUCOSE 99   Radiology/Studies:  Dg Chest 2 View 07/02/2013   CLINICAL DATA:  Shortness of breath and chest pain  EXAM: CHEST  2 VIEW  COMPARISON:  None.  FINDINGS: The heart size and mediastinal contours are within normal limits. Both lungs are clear. The visualized skeletal structures are unremarkable.  IMPRESSION: No active cardiopulmonary disease.   Electronically Signed   By: Sherian Rein M.D.   On: 07/02/2013 12:58   EKG: NSR 53bpm, low voltage precordial leads, TWI avL and V2, flattening I, V3; V4. No prior to compare to.  Physical Exam: Blood pressure 105/69, pulse 79, temperature 97.4 F (36.3 C), temperature source Oral, resp. rate 18, height 5\' 9"  (1.753 m), weight 230 lb (104.327 kg), SpO2 94.00%. General: Well developed, well nourished overweight WF in no acute distress. Head: Normocephalic, atraumatic, sclera non-icteric, no xanthomas, nares are without  discharge.  Neck: Negative for carotid bruits. JVD not elevated. Lungs: Clear bilaterally to auscultation without wheezes, rales, or rhonchi. Breathing is unlabored. Heart: RRR with S1 S2. No murmurs, rubs, or gallops appreciated. Abdomen: Soft, non-tender, non-distended with normoactive bowel sounds. No hepatomegaly. No rebound/guarding. No obvious abdominal masses. Msk:  Strength and tone appear normal for age. Extremities: No clubbing or cyanosis. No edema.  Distal pedal pulses are 2+ and equal bilaterally. R foot in leg cast Neuro: Alert and oriented X 3. No facial asymmetry. No focal deficit. Moves all extremities spontaneously. Psych:  Responds to questions appropriately with a normal affect.   Assessment and Plan:   1. Chest pain - some features concerning for [place], but pain is somewhat  reproducible with palpation. She has multiple cardiac risk factors. Continue ASA, beta blocker, statin. Hold off full dose heparin unless enzymes turn positive given recent surgery today. 2D echo being done right now - Dr. Tenny Craw plans to review. If enzymes turn positive, has further pain with EKG changes, or abnormal EF by echo, will need cath. Otherwise will plan Lexiscan cardiolite in AM. 2. Hypertension with hypotension after surgery/NTG today - agree with holding Lotrel for today and consider resuming as BP allows. 3. Hyperlipidemia - previously borderline per patient, not on meds. Check lipid panel. Statin has been started. 4. Ongoing longstanding tobacco abuse - counseled extensively on cessation. She does not appear ready to quit. 5. Achilles tendinopathy/bone spur s/p surgery POD#0 - per IM.   Signed, Ronie Spies PA-C 07/02/2013, 4:59 PM  Patient seen and examined  Agree with findings as noted by D Dunn above.  I have amended note to reflect my findings.   I have reviewed echo report  LVEF is normal.   Some of symptoms are concerning (pain at night, dyspnea) I would recomm lexiscan myoview to r/o  ischemia. Would check lipids   Discussed risks of tobacco with patient and family.  Would recomm that she quit.

## 2013-07-02 NOTE — ED Provider Notes (Signed)
CSN: [id number]     Arrival date & time 07/02/13  1159 History   First MD Initiated Contact with Patient 07/02/13 1212     Chief Complaint  Patient presents with  . Chest Pain  . Shortness of Breath     (Consider location/radiation/quality/duration/timing/severity/associated sxs/prior Treatment) The history is provided by the patient and a relative. No language interpreter was used.    This is a 53 year old female with past medical history of sleep apnea, hyper lipidemia, hypertension, obesity, and a 30-pack-year smoking history who presents to the ED for chief complaint of chest pain.  The patient had surgery on the right foot performed this morning.  Patient awoke from her anesthesia with sudden onset severe or central crushing chest pain, nausea, shortness of breath, radiation to the back and left arm.  Patient states that she began retching.  Patient was given an aspirin, one sublingual nitroglycerin and Zofran with relief of her symptoms prior to arrival.  She denies any previous history of heart disease.  Father had his first MI at age of 51.  She denies a history of diabetes.  Pain is currently resolved. No Hx or sxs prior to surgery of DVT. Denies fevers, chills, myalgias, arthralgias.  Denies dysuria, flank pain, suprapubic pain, frequency, urgency, or hematuria. Denies headaches, light headedness, weakness, visual disturbances. Denies abdominal pain, nausea, vomiting, diarrhea or constipation.    Past Medical History  Diagnosis Date  . Hypertension   . Hyperlipidemia   . Sleep apnea    Past Surgical History  Procedure Laterality Date  . Abdominal hysterectomy    . Cholecystectomy    . Breast biopsy Left    Family History  Problem Relation Age of Onset  . Heart disease Father    History  Substance Use Topics  . Smoking status: Current Every Day Smoker -- 0.50 packs/day for 30 years    Types: Cigarettes  . Smokeless tobacco: Never Used  . Alcohol Use: Yes     Comment:  occasionally   OB History   Grav Para Term Preterm Abortions TAB SAB Ect Mult Living                 Review of Systems  Ten systems reviewed and are negative for acute change, except as noted in the HPI.    Allergies  Penicillins  Home Medications   Current Outpatient Rx  Name  Route  Sig  Dispense  Refill  . amLODipine-benazepril (LOTREL) 10-20 MG per capsule   Oral   Take 1 capsule by mouth daily.         . hydrochlorothiazide (MICROZIDE) 12.5 MG capsule   Oral   Take 12.5 mg by mouth daily.          BP 97/60  Pulse 54  Temp(Src) 97.5 F (36.4 C)  Resp 12  SpO2 94% Physical Exam Physical Exam  Nursing note and vitals reviewed. Constitutional: She is oriented to person, place, and time. She appears well-developed and well-nourished. No distress.  HENT:  Head: Normocephalic and atraumatic.  Eyes: Conjunctivae normal and EOM are normal. Pupils are equal, round, and reactive to light. No scleral icterus.  Neck: Normal range of motion.  Cardiovascular: Normal rate, regular rhythm and normal heart sounds.  Exam reveals no gallop and no friction rub.   No murmur heard. Pulmonary/Chest: Effort normal and breath sounds normal. No respiratory distress.  Abdominal: Soft. Bowel sounds are normal. She exhibits no distension and no mass. There is no tenderness. There  is no guarding.  Musculoskeletal: No tenderness to palpation and chest wall are back.   Neurological: She is alert and oriented to person, place, and time.  patient is still slowed from her anesthesia. Skin: Skin is warm and dry. She is not diaphoretic.     ED Course  Procedures (including critical care time) Labs Review Labs Reviewed  CBC - Abnormal; Notable for the following:    WBC 13.0 (*)    All other components within normal limits  COMPREHENSIVE METABOLIC PANEL - Abnormal; Notable for the following:    Albumin 3.1 (*)    Total Bilirubin 0.2 (*)    All other components within normal limits   URINALYSIS, ROUTINE W REFLEX MICROSCOPIC  I-STAT TROPOININ, ED   Imaging Review Dg Chest 2 View  07/02/2013   CLINICAL DATA:  Shortness of breath and chest pain  EXAM: CHEST  2 VIEW  COMPARISON:  None.  FINDINGS: The heart size and mediastinal contours are within normal limits. Both lungs are clear. The visualized skeletal structures are unremarkable.  IMPRESSION: No active cardiopulmonary disease.   Electronically Signed   By: Sherian Rein M.D.   On: 07/02/2013 12:58    EKG Interpretation  None  Date: 07/02/2013  Rate: 53  Rhythm: normal sinus rhythm  QRS Axis: normal  Intervals: normal  ST/T Wave abnormalities: nonspecific T wave changes  Conduction Disutrbances:none  Narrative Interpretation:   Old EKG Reviewed: none available    MDM   Final diagnoses:  None    Patient here with multiple risk factors for acute coronary syndrome.  Her heart score is 6 making her moderate risk for major adverse cardiac event. Patient has elevated white count.  Chest x-ray negative for acute abnormality. I personally reviewed the images using our PACS system. EKG shows T-wave flattening in the anterolateral leads.  No previous EKG to compare.  I personally reviewed and interpreted EKGs. Patient's symptoms were also relieved with nitroglycerin and aspirin.  Considering these facts I feel the patient will need admission for a ACS rule out.  She is currently pain-free at this time  Patient accepted for admission by dr. Janee Morn. Patient agrees with poc. The patient appears reasonably stabilized for admission considering the current resources, flow, and capabilities available in the ED at this time, and I doubt any other [facility] requiring further screening and/or treatment in the ED prior to admission.     Arthor Captain, PA-C 07/05/13 0401

## 2013-07-02 NOTE — ED Notes (Signed)
Patient transported to X-ray 

## 2013-07-02 NOTE — ED Notes (Signed)
Per EMS pt came from Dr. Isidore Moosffice where she had foot surgery this AM. During recovery she started having CP midsternal, radiating to left arm and back. Pt also had SOB and nausea associated with pain. Pt has been given 324 ASA and 1 nitroglycerin, a total of 8mg  zofran IV and 1,000 mL LR IV. After nitroglycerin BP decreased to 80/ systolic. BP now 111/67. Pt AAOx4 upon arrival.

## 2013-07-02 NOTE — Progress Notes (Signed)
  Echocardiogram 2D Echocardiogram has been performed.  Georgian CoWILLIAMS, Esterlene Atiyeh 07/02/2013, 6:07 PM

## 2013-07-02 NOTE — H&P (Signed)
[facility] History and Physical  Keniyah Gelinas ZOX:[id number] DOB: [birth year] DOA: 07/02/2013  Referring physician: Arthor Captain, PA PCP: Dema Severin, NP   Chief Complaint: Chest pain and shortness of breath  HPI: Ashley Bowers is a 53 y.o. female  With history of hypertension, hyperlipidemia, obesity, history of sleep apnea however patient states has never been formally diagnosed, extensive tobacco history, family history of coronary artery disease with acute MI in a father at age 53 presented to the ED post operatively after right foot surgery with crushing midsternal chest pain. Patient states has just undergone a rectal calcaneal heel spur resection and activities tendon lysis with endoscopic plantar fasciotomy and application of below the knee cast on the morning of admission. 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 Patient was given some nitroglycerin and aspirin per EMS with relief of her chest pain. Patient was subsequently presented to the ED. Patient denies any fevers, no chills, no dysuria, no vomiting, no change in her chronic intermittent abdominal pain, no diarrhea, no weakness, no cough, no headaches. EKG which was done showed low voltage and nonspecific T-wave abnormalities. Point-of-care cardiac markers were negative. Comprehensive metabolic profile and albumin of 3.1 otherwise was within normal limits. CBC had a white count of 13 otherwise was within normal limits. Chest x-ray done was negative for any acute cardiopulmonary disease. We were called to admit the patient for further evaluation and management.   Review of Systems: As per history of present illness otherwise negative. Constitutional:  No weight loss, night sweats, Fevers, chills, fatigue.  HEENT:  No  headaches, Difficulty swallowing,Tooth/dental problems,Sore throat,  No sneezing, itching, ear ache, nasal congestion, post nasal drip,  Cardio-vascular:  No chest pain, Orthopnea, PND, swelling in lower extremities, anasarca, dizziness, palpitations  GI:  No heartburn, indigestion, abdominal pain, nausea, vomiting, diarrhea, change in bowel habits, loss of appetite  Resp:  No shortness of breath with exertion or at rest. No excess mucus, no productive cough, No non-productive cough, No coughing up of blood.No change in color of mucus.No wheezing.No chest wall deformity  Skin:  no rash or lesions.  GU:  no dysuria, change in color of urine, no urgency or frequency. No flank pain.  Musculoskeletal:  No joint pain or swelling. No decreased range of motion. No back pain.  Psych:  No change in mood or affect. No depression or anxiety. No memory loss.   Past Medical History  Diagnosis Date  . Hypertension   . Hyperlipidemia   . Sleep apnea   . HTN (hypertension) 07/02/2013  . Obesity, unspecified 07/02/2013  . Tobacco use disorder 07/02/2013  . Achilles tendinitis of right lower extremity 07/02/2013  . Heel spur: RETROCALCANEAL HEEL SPUR 07/02/2013   Past Surgical History  Procedure Laterality Date  . Abdominal hysterectomy    . Cholecystectomy    . Breast biopsy Left    Social History:  reports that she has been smoking Cigarettes.  She has a 15 pack-year smoking history. She has never used smokeless tobacco. She reports that she drinks alcohol. She reports that she does not use illicit drugs.  Allergies  Allergen Reactions  . Penicillins Swelling    Family History  Problem Relation Age of Onset  . Heart disease Father      Prior to Admission medications  Medication Sig Start Date End Date Taking? Authorizing Provider  amLODipine-benazepril (LOTREL) 10-20 MG per capsule Take 1 capsule by mouth daily.   Yes Historical Provider, MD  hydrochlorothiazide (MICROZIDE) 12.5 MG  capsule Take 12.5 mg by mouth daily.   Yes Historical Provider, MD   Physical Exam: Filed Vitals:   07/02/13 1500  BP: 105/69  Pulse: 79  Temp: 97.4 F (36.3 C)  Resp: 18    BP 105/69  Pulse 79  Temp(Src) 97.4 F (36.3 C) (Oral)  Resp 18  Ht 5\' 9"  (1.753 m)  Wt 104.327 kg (230 lb)  BMI 33.95 kg/m2  SpO2 94%  General:  Obese female speaking in full sentences in no acute cardiopulmonary distress.  Eyes: PERRLA, EOMI, normal lids, irises & conjunctiva ENT: grossly normal hearing, lips & tongue Neck: no LAD, masses or thyromegaly Cardiovascular: RRR, no m/r/g. No LE edema. Right lower extremity in a cast. Telemetry: SR, no arrhythmias  Respiratory: CTA bilaterally, no w/r/r. Normal respiratory effort. Abdomen: soft, ntnd,  positive bowel sounds, no rebound, no guarding. Skin: no rash or induration seen on limited exam Musculoskeletal: grossly normal tone BUE/BLE Psychiatric: grossly normal mood and affect, speech fluent and appropriate Neurologic: Alert and oriented x3. Cranial nerves II through XII are grossly intact. Sensation is intact. Visual fields are intact. Gait not tested secondary to safety. No focal deficits.           Labs on Admission:  Basic Metabolic Panel:  Recent Labs Lab 07/02/13 1230  NA 138  K 4.2  CL 105  CO2 22  GLUCOSE 99  BUN 14  CREATININE 0.69  CALCIUM 8.5   Liver Function Tests:  Recent Labs Lab 07/02/13 1230  AST 14  ALT 12  ALKPHOS 58  BILITOT 0.2*  PROT 6.1  ALBUMIN 3.1*   No results found for this basename: LIPASE, AMYLASE,  in the last 168 hours No results found for this basename: AMMONIA,  in the last 168 hours CBC:  Recent Labs Lab 07/02/13 1230  WBC 13.0*  HGB 13.0  HCT 39.3  MCV 93.6  PLT 211   Cardiac Enzymes:  Recent Labs Lab 07/02/13 1452  TROPONINI <0.30    BNP (last 3 results) No results found for this basename: PROBNP,  in the last 8760 hours CBG: No results found for this basename: GLUCAP,   in the last 168 hours  Radiological Exams on Admission: Dg Chest 2 View  07/02/2013   CLINICAL DATA:  Shortness of breath and chest pain  EXAM: CHEST  2 VIEW  COMPARISON:  None.  FINDINGS: The heart size and mediastinal contours are within normal limits. Both lungs are clear. The visualized skeletal structures are unremarkable.  IMPRESSION: No active cardiopulmonary disease.   Electronically Signed   By: Sherian Rein M.D.   On: 07/02/2013 12:58    EKG: Independently reviewed. Normal sinus rhythm. Low voltage. Nonspecific ST-T wave abnormalities.  Assessment/Plan Principal Problem:   Chest pain Active Problems:   HTN (hypertension)   Hyperlipidemia   Obesity, unspecified   Tobacco use disorder   Achilles tendinitis of right lower extremity   Heel spur: RETROCALCANEAL HEEL SPUR  #1 chest pain Patient is present with chest pain which he describes as a crushing chest pain with radiation to the left upper extremity and back relieved with nitroglycerin and aspirin. Patient with multiple risk factors of hypertension, obesity, tobacco abuse, family history of coronary artery disease, hyperlipidemia. Will admit the patient to telemetry. Cycle cardiac enzymes  every 6 hours x3. Check a 2-D echo. Check a TSH. Check coags. Place patient on nitroglycerin as needed, oxygen, aspirin, low dose beta blocker. Consult with cardiology for further evaluation and management.  #2 hypertension Patient blood pressure has been borderline on admission which was felt to be secondary to effects of nitroglycerin. Will hold antihypertensive medications for now. We'll place on low dose Coreg and monitor blood pressure closely. Gentle IV hydration.  #3 hyperlipidemia Check a fasting lipid panel. Place on a statin.  #4 tobacco abuse Tobacco cessation. Place on a nicotine patch.  #5 Achilles tendinitis of the right lower extremity/retrocalcaneal heel spur Status post retrocalcaneal heel spur resection and Achilles  tendon lysis and endoscopic plantar fasciotomy and and application of a below-knee cast per Dr. Al CorpusHyatt. Pain management. PT/OT.  #6 prophylaxis Lovenox for DVT prophylaxis.   Code Status: Full Family Communication: Updated patient, mother, fianc, children at bedside. Disposition Plan: Admit to telemetry.  Time spent: 65mins  [facility] DivisionHOMPSON,Daley Gosse MD [facility] Pager [phone]

## 2013-07-02 NOTE — ED Notes (Signed)
Pt reports CP improves when she presses on it and increases with deep breathing.

## 2013-07-03 ENCOUNTER — Inpatient Hospital Stay (HOSPITAL_COMMUNITY): Payer: BC Managed Care – PPO

## 2013-07-03 DIAGNOSIS — M7730 Calcaneal spur, unspecified foot: Secondary | ICD-10-CM

## 2013-07-03 LAB — CBC
HEMATOCRIT: 35.3 % — AB (ref 36.0–46.0)
Hemoglobin: 11.7 g/dL — ABNORMAL LOW (ref 12.0–15.0)
MCH: 31.4 pg (ref 26.0–34.0)
MCHC: 33.1 g/dL (ref 30.0–36.0)
MCV: 94.6 fL (ref 78.0–100.0)
Platelets: 206 10*3/uL (ref 150–400)
RBC: 3.73 MIL/uL — ABNORMAL LOW (ref 3.87–5.11)
RDW: 13.9 % (ref 11.5–15.5)
WBC: 10.4 10*3/uL (ref 4.0–10.5)

## 2013-07-03 LAB — LIPID PANEL
Cholesterol: 164 mg/dL (ref 0–200)
HDL: 32 mg/dL — AB (ref >39)
LDL CALC: 103 mg/dL — AB (ref 0–99)
Total CHOL/HDL Ratio: 5.1 RATIO
Triglycerides: 143 mg/dL (ref <150)
VLDL: 29 mg/dL (ref 0–40)

## 2013-07-03 LAB — BASIC METABOLIC PANEL
BUN: 17 mg/dL (ref 6–23)
CALCIUM: 8.1 mg/dL — AB (ref 8.4–10.5)
CO2: 25 mEq/L (ref 19–32)
Chloride: 104 mEq/L (ref 96–112)
Creatinine, Ser: 0.78 mg/dL (ref 0.50–1.10)
Glucose, Bld: 106 mg/dL — ABNORMAL HIGH (ref 70–99)
POTASSIUM: 3.7 meq/L (ref 3.7–5.3)
Sodium: 141 mEq/L (ref 137–147)

## 2013-07-03 LAB — PROTIME-INR
INR: 0.93 (ref 0.00–1.49)
Prothrombin Time: 12.3 seconds (ref 11.6–15.2)

## 2013-07-03 LAB — TROPONIN I: Troponin I: <0.3 ng/mL (ref <0.30)

## 2013-07-03 MED ORDER — PROMETHAZINE HCL 12.5 MG PO TABS: 12.5000 mg | ORAL_TABLET | Freq: Four times a day (QID) | ORAL | Status: DC | PRN

## 2013-07-03 MED ORDER — TECHNETIUM TC 99M SESTAMIBI - CARDIOLITE
30.0000 | Freq: Once | INTRAVENOUS | Status: AC | PRN
  Administered 2013-07-03: 10:00:00 30 via INTRAVENOUS

## 2013-07-03 MED ORDER — SIMVASTATIN 20 MG PO TABS: 20.0000 mg | ORAL_TABLET | Freq: Every day | ORAL | Status: DC

## 2013-07-03 MED ORDER — OXYCODONE-ACETAMINOPHEN 5-325 MG PO TABS: 1.0000 | ORAL_TABLET | ORAL | Status: DC | PRN

## 2013-07-03 MED ORDER — OMEPRAZOLE 20 MG PO CPDR: 20.0000 mg | DELAYED_RELEASE_CAPSULE | Freq: Every day | ORAL | Status: DC

## 2013-07-03 MED ORDER — ASPIRIN 81 MG PO TBEC: 81.0000 mg | DELAYED_RELEASE_TABLET | Freq: Every day | ORAL | Status: AC

## 2013-07-03 MED ORDER — NITROGLYCERIN 0.4 MG SL SUBL: 0.4000 mg | SUBLINGUAL_TABLET | SUBLINGUAL | Status: DC | PRN

## 2013-07-03 MED ORDER — REGADENOSON 0.4 MG/5ML IV SOLN
INTRAVENOUS | Status: AC
  Filled 2013-07-03: qty 5

## 2013-07-03 MED ORDER — REGADENOSON 0.4 MG/5ML IV SOLN
0.4000 mg | Freq: Once | INTRAVENOUS | Status: AC
  Administered 2013-07-03: 0.4 mg via INTRAVENOUS
  Filled 2013-07-03: qty 5

## 2013-07-03 MED ORDER — CLINDAMYCIN HCL 150 MG PO CAPS: 150.0000 mg | ORAL_CAPSULE | Freq: Three times a day (TID) | ORAL | Status: DC

## 2013-07-03 MED ORDER — TECHNETIUM TC 99M SESTAMIBI - CARDIOLITE
10.0000 | Freq: Once | INTRAVENOUS | Status: AC | PRN
  Administered 2013-07-03: 10 via INTRAVENOUS

## 2013-07-03 NOTE — Progress Notes (Signed)
Patient ID: Ashley Bowers  female  ZOX:[id number]    DOB: [birth year]    DOA: 07/02/2013  PCP: Dema Severin, NP  Assessment/Plan: Principal Problem:   Chest pain - Currently resolved, ruled out - 2-D echo reviewed EF 55-60%, no regional wall motion abnormalities, grade 1 diastolic dysfunction - Nuclear medicine stress test done today showed mild reversibility involving And apical segment of the lateral wall suspicious for inducible ischemia, fixed defect in the apical segment of the entire lateral wall question remote infarct, EF 71% - Discussed in detail with cardiology, recommended possible inpatient versus outpatient cardiac cath depending on patient's circumstances especially with her recent right lower extremity surgery yesterday - I discussed with the patient and updated the stress test results and recommendations from cardiology, patient wishes for inpatient cardiac catheterization, relayed to Dr. Diona Browner.  - Continue aspirin, Coreg, statin  Active Problems:   HTN (hypertension) currently borderline hypotension - Patient was on amlodipine, benazepril, HCTZ at home, currently on on Coreg only 3.25mg  bid.  - Per cardiology, continue coreg    Hyperlipidemia - Started on statin    Obesity, unspecified - Counseled on diet and weight control    Tobacco use disorder - Counseled on nicotine cessation    Achilles tendinitis of right lower extremity   Heel spur: RETROCALCANEAL HEEL SPUR - Continue pain control, clindamycin, DVT prophylaxis   DVT Prophylaxis: Lovenox  Code Status:  Family Communication: Discussed in detail with the patient and the family member in the room  Disposition:  Consultants:  Cardiology  Procedures:  2-D echo   stress test  Antibiotics:  Clindamycin    Subjective: Patient seen and examined in the a.m., denied any chest pain or shortness of breath Postop day 1 foot surgery  Objective: Weight change:  No intake or output data in the  24 hours ending 07/03/13 1353 Blood pressure 104/61, pulse 74, temperature 98 F (36.7 C), temperature source Oral, resp. rate 18, height 5\' 9"  (1.753 m), weight 104.327 kg (230 lb), SpO2 93.00%.  Physical Exam: General: Alert and awake, oriented x3, not in any acute distress. CVS: S1-S2 clear, no murmur rubs or gallops Chest: clear to auscultation bilaterally, no wheezing, rales or rhonchi Abdomen: soft nontender, nondistended, normal bowel sounds  Extremities:  right lower extremity dressing intact   Lab Results: Basic Metabolic Panel:  Recent Labs Lab 07/02/13 1230 07/02/13 1655 07/03/13 0412  NA 138  --  141  K 4.2  --  3.7  CL 105  --  104  CO2 22  --  25  GLUCOSE 99  --  106*  BUN 14  --  17  CREATININE 0.69 0.66 0.78  CALCIUM 8.5  --  8.1*  MG  --  2.0  --    Liver Function Tests:  Recent Labs Lab 07/02/13 1230  AST 14  ALT 12  ALKPHOS 58  BILITOT 0.2*  PROT 6.1  ALBUMIN 3.1*   No results found for this basename: LIPASE, AMYLASE,  in the last 168 hours No results found for this basename: AMMONIA,  in the last 168 hours CBC:  Recent Labs Lab 07/02/13 1655 07/03/13 0412  WBC 11.6* 10.4  HGB 13.0 11.7*  HCT 38.4 35.3*  MCV 93.2 94.6  PLT 217 206   Cardiac Enzymes:  Recent Labs Lab 07/02/13 1452 07/02/13 2036 07/03/13 0412  TROPONINI <0.30 <0.30 <0.30   BNP: No components found with this basename: POCBNP,  CBG: No results found for this basename: GLUCAP,  in the last 168 hours   Micro Results: No results found for this or any previous visit (from the past 240 hour(s)).  Studies/Results: Dg Chest 2 View  07/02/2013   CLINICAL DATA:  Shortness of breath and chest pain  EXAM: CHEST  2 VIEW  COMPARISON:  None.  FINDINGS: The heart size and mediastinal contours are within normal limits. Both lungs are clear. The visualized skeletal structures are unremarkable.  IMPRESSION: No active cardiopulmonary disease.   Electronically Signed   By:  Sherian ReinWei-Chen  Lin M.D.   On: 07/02/2013 12:58   Nm Myocar Multi W/spect W/wall Motion / Ef  07/03/2013   CLINICAL DATA:  Chest pain hypertension. Hyperlipidemia. Tobacco use.  EXAM: MYOCARDIAL IMAGING WITH SPECT (REST AND PHARMACOLOGIC-STRESS)  GATED LEFT VENTRICULAR WALL MOTION STUDY  LEFT VENTRICULAR EJECTION FRACTION  TECHNIQUE: Standard myocardial SPECT imaging was performed after resting intravenous injection of 10 mCi [id number] sestamibi. Subsequently, intravenous infusion of Lexiscan was performed under the supervision of the Cardiology staff. At peak effect of the drug, 30 mCi Tc-4467m sestamibi was injected intravenously and standard myocardial SPECT imaging was performed. Quantitative gated imaging was also performed to evaluate left ventricular wall motion, and estimate left ventricular ejection fraction.  COMPARISON:  DG CHEST 2 VIEW dated 07/02/2013  FINDINGS: Rest images demonstrate mild decreased uptake within the apical segment of the anterior lateral wall.  Stress images demonstrate mild reversibility involving the apex and apical segment of the lateral wall.  Evaluation of wall motion is within normal limits. Ejection fraction is estimated at 71%. End-diastolic volume of 100 cc. End systolic volume of 29 cc.  IMPRESSION: 1. Mild reversibility involving the apex and apical segment of the lateral wall. This is suspicious for inducible ischemia. These results will be called to the ordering clinician or representative by the Radiologist Assistant, and communication documented in the PACS Dashboard. 2. Underlying fixed defect involving the apical segment of the anterolateral wall. Question remote infarct. 3. Normal left ventricular wall motion.   Electronically Signed   By: Jeronimo GreavesKyle  Talbot M.D.   On: 07/03/2013 13:29    Medications: Scheduled Meds: . aspirin  324 mg Oral NOW   Or  . aspirin  300 mg Rectal NOW  . aspirin EC  81 mg Oral Daily  . carvedilol  3.125 mg Oral BID WC  . clindamycin  150 mg Oral  3 times per day  . enoxaparin (LOVENOX) injection  40 mg Subcutaneous Q24H  . nicotine  21 mg Transdermal Daily  . regadenoson      . simvastatin  20 mg Oral q1800      LOS: 1 day   Marwin Primmer M.D. [facility] 07/03/2013, 1:53 PM Pager: [phone]  If 7PM-7AM, please contact night-coverage [email or web address] Password [email or web address]

## 2013-07-03 NOTE — Progress Notes (Signed)
    Consulting cardiologist: Dr. Dietrich PatesPaula Ross  At the time of rounds today, patient in the nuclear medicine department for a Myoview.  Lab Results:  Basic Metabolic Panel:  Recent Labs Lab 07/02/13 1230 07/02/13 1655 07/03/13 0412  NA 138  --  141  K 4.2  --  3.7  CL 105  --  104  CO2 22  --  25  GLUCOSE 99  --  106*  BUN 14  --  17  CREATININE 0.69 0.66 0.78  CALCIUM 8.5  --  8.1*  MG  --  2.0  --     CBC:  Recent Labs Lab 07/02/13 1230 07/02/13 1655 07/03/13 0412  WBC 13.0* 11.6* 10.4  HGB 13.0 13.0 11.7*  HCT 39.3 38.4 35.3*  MCV 93.6 93.2 94.6  PLT 211 217 206    Cardiac Enzymes:  Recent Labs Lab 07/02/13 1452 07/02/13 2036 07/03/13 0412  TROPONINI <0.30 <0.30 <0.30    ECG: Normal sinus rhythm.  Echocardiogram (2/27); Study Conclusions  - Left ventricle: The cavity size was normal. Wall thickness was normal. Systolic function was normal. The estimated ejection fraction was in the range of 55% to 60%. Wall motion was normal; there were no regional wall motion abnormalities. Doppler parameters are consistent with abnormal left ventricular relaxation (grade 1 diastolic dysfunction). - Left atrium: The atrium was mildly dilated.   Medications:   Scheduled Medications: . aspirin  324 mg Oral NOW   Or  . aspirin  300 mg Rectal NOW  . aspirin EC  81 mg Oral Daily  . carvedilol  3.125 mg Oral BID WC  . clindamycin  150 mg Oral 3 times per day  . enoxaparin (LOVENOX) injection  40 mg Subcutaneous Q24H  . nicotine  21 mg Transdermal Daily  . regadenoson      . simvastatin  20 mg Oral q1800      PRN Medications:  nitroGLYCERIN, oxyCODONE-acetaminophen   Assessment:   1. Chest pain syndrome, typical and atypical features in the setting of multiple cardiac risk factors. ECG normal and cardiac markers also normal. LVEF normal without wall motion abnormalities by echocardiogram. She is scheduled for a Myoview today as per Dr. Tenny Crawoss.  2.  Ongoing tobacco use. Cessation has been recommended.  3. Hypertension, blood pressure currently stable.  4. History of hyperlipidemia, statin empirically started. Current LDL 103.  5. Patient recently underwent heel spur resection and tendon surgery for achilles tendinopathy with endoscopic plantar fasciotomy and application of below the knee cast.   Plan/Discussion:    Patient undergoing Myoview at the time of rounds this morning. She has ruled out for myocardial infarction, ECG is normal, and echocardiogram demonstrates no wall motion abnormalities with normal LVEF. Ms. Raford PitcherBarrett PA-C will be assessing patient during her stress test. Our service will followup on results later today and can make further recommendations.  Jonelle SidleSamuel G. McDowell, M.D., F.A.C.C.

## 2013-07-03 NOTE — Evaluation (Signed)
Physical Therapy Evaluation Patient Details Name: Ashley Bowers MRN: [id number] DOB: 05/20/1960 Today's Date: 07/03/2013 Time: 1410-1430 PT Time Calculation (min): 20 min  PT Assessment / Plan / Recommendation History of Present Illness  Ms. Ashley Bowers is a 53 y/o F with history of HTN, HLD, 35 yrs of tobacco abuse, family history CAD (father-CABG ~age 51) who [facility] with an episode of chest pain following foot surgery. She underwent heel spur resection and tendon surgery for achilles tendinopathy with endoscopic plantar fasciotomy and application of below the knee cast. She awoke from anesthesia and remembers hearing the nurse say her blood pressure was dropping. She developed substernal 10/10 pressure-like chest pain with associated tingling in her left arm, nausea, and dyspnea. She was given NTG. Currently pt with abnormal stress test and to undergo cardiac cath on Mon 07/05/13  Clinical Impression  Pt is mod I with activity and is able to mobilize short distances with RW keeping RLE NWB. No acute PT indicated at this time.     PT Assessment  Patent does not need any further PT services    Follow Up Recommendations  No PT follow up    Does the patient have the potential to tolerate intense rehabilitation      Barriers to Discharge        Equipment Recommendations  None recommended by PT    Recommendations for Other Services     Frequency      Precautions / Restrictions Precautions Precautions: None Required Braces or Orthoses: Other Brace/Splint Other Brace/Splint: cast right LE below knee Restrictions Weight Bearing Restrictions: Yes RLE Weight Bearing: Non weight bearing   Pertinent Vitals/Pain VSS      Mobility  Bed Mobility Overal bed mobility: Independent Transfers Overall transfer level: Modified independent Equipment used: Rolling walker (2 wheeled) General transfer comment: pt stand safely from  toilet Ambulation/Gait Ambulation/Gait assistance: Modified independent (Device/Increase time) Ambulation Distance (Feet): 12 Feet Assistive device: Rolling walker (2 wheeled) Gait Pattern/deviations: Step-to pattern General Gait Details: pt able to ambulate safely with RW without bearing wt through RLE    Exercises     PT Diagnosis:    PT Problem List:   PT Treatment Interventions:       PT Goals(Current goals can be found in the care plan section) Acute Rehab PT Goals Patient Stated Goal: go home PT Goal Formulation: No goals set, d/c therapy  Visit Information  Last PT Received On: 07/03/13 Assistance Needed: +1 History of Present Illness: Ms. Ashley Bowers is a 53 y/o F with history of HTN, HLD, 35 yrs of tobacco abuse, family history CAD (father-CABG ~age 51) who [facility] with an episode of chest pain following foot surgery. She underwent heel spur resection and tendon surgery for achilles tendinopathy with endoscopic plantar fasciotomy and application of below the knee cast. She awoke from anesthesia and remembers hearing the nurse say her blood pressure was dropping. She developed substernal 10/10 pressure-like chest pain with associated tingling in her left arm, nausea, and dyspnea. She was given NTG. Currently pt with abnormal stress test and to undergo cardiac cath on Mon 07/05/13       Prior Functioning  Home Living Family/patient expects to be discharged to:: Private residence Living Arrangements: Spouse/significant other Available Help at Discharge: Available PRN/intermittently;Family Type of Home: House Home Access: Stairs to enter Entergy CorporationEntrance Stairs-Number of Steps: 2 Entrance Stairs-Rails: Right Home Layout: One level Home Equipment: Walker - 2 wheels;Crutches Additional Comments: elevated  toilet in master bath Prior Function Level of Independence: Independent Communication Communication: No difficulties    Cognition   Cognition Arousal/Alertness: Awake/alert Behavior During Therapy: WFL for tasks assessed/performed Overall Cognitive Status: Within Functional Limits for tasks assessed    Extremity/Trunk Assessment Upper Extremity Assessment Upper Extremity Assessment: Overall WFL for tasks assessed Lower Extremity Assessment Lower Extremity Assessment: Overall WFL for tasks assessed Cervical / Trunk Assessment Cervical / Trunk Assessment: Normal   Balance Balance Overall balance assessment: Modified Independent  End of Session PT - End of Session Activity Tolerance: Patient tolerated treatment well Patient left: in bed;with call bell/phone within reach;with family/visitor present Nurse Communication: Mobility status  GP   Lyanne Co, PT  Acute Rehab Services  [phone]   Jane Lew, [place] 07/03/2013, 2:52 PM

## 2013-07-04 DIAGNOSIS — F17200 Nicotine dependence, unspecified, uncomplicated: Secondary | ICD-10-CM

## 2013-07-04 LAB — URINE CULTURE
CULTURE: NO GROWTH
Colony Count: NO GROWTH

## 2013-07-04 MED ORDER — SODIUM CHLORIDE 0.9 % IV SOLN: 250.0000 mL | INTRAVENOUS | Status: DC | PRN

## 2013-07-04 MED ORDER — ASPIRIN 81 MG PO CHEW
81.0000 mg | CHEWABLE_TABLET | ORAL | Status: AC
  Administered 2013-07-05: 81 mg via ORAL
  Filled 2013-07-04: qty 1

## 2013-07-04 MED ORDER — IBUPROFEN 600 MG PO TABS
600.0000 mg | ORAL_TABLET | Freq: Four times a day (QID) | ORAL | Status: DC | PRN
  Administered 2013-07-04: 600 mg via ORAL
  Filled 2013-07-04 (×2): qty 1

## 2013-07-04 MED ORDER — SODIUM CHLORIDE 0.9 % IJ SOLN: 3.0000 mL | Freq: Two times a day (BID) | INTRAMUSCULAR | Status: DC

## 2013-07-04 MED ORDER — SODIUM CHLORIDE 0.9 % IV SOLN
1.0000 mL/kg/h | INTRAVENOUS | Status: DC
  Administered 2013-07-05: 1 mL/kg/h via INTRAVENOUS

## 2013-07-04 MED ORDER — SODIUM CHLORIDE 0.9 % IJ SOLN: 3.0000 mL | INTRAMUSCULAR | Status: DC | PRN

## 2013-07-04 NOTE — Progress Notes (Signed)
Consulting cardiologist: Dr. Dietrich Pates  Subjective:    No chest pain or shortness of breath.  Objective:   Temp:  [97.9 F (36.6 C)-98 F (36.7 C)] 98 F (36.7 C) (03/01 0430) Pulse Rate:  [54-81] 80 (03/01 0430) Resp:  [17-18] 18 (03/01 0430) BP: (97-145)/(61-83) 145/83 mmHg (03/01 0430) SpO2:  [96 %-97 %] 96 % (03/01 0430) Weight:  [243 lb 14.4 oz (110.632 kg)] 243 lb 14.4 oz (110.632 kg) (03/01 0246) Last BM Date: 07/01/13  Filed Weights   07/02/13 1500 07/04/13 0246  Weight: 230 lb (104.327 kg) 243 lb 14.4 oz (110.632 kg)    Intake/Output Summary (Last 24 hours) at 07/04/13 0933 Last data filed at 07/03/13 1800  Gross per 24 hour  Intake    240 ml  Output      0 ml  Net    240 ml    Telemetry: Sinus rhythm.  Exam:  General: No distress.  Lungs: Clear, nonlabored.  Cardiac: RRR, no gallop.  Extremities: Right below the knee cast.   Lab Results:  Basic Metabolic Panel:  Recent Labs Lab 07/02/13 1230 07/02/13 1655 07/03/13 0412  NA 138  --  141  K 4.2  --  3.7  CL 105  --  104  CO2 22  --  25  GLUCOSE 99  --  106*  BUN 14  --  17  CREATININE 0.69 0.66 0.78  CALCIUM 8.5  --  8.1*  MG  --  2.0  --     CBC:  Recent Labs Lab 07/02/13 1230 07/02/13 1655 07/03/13 0412  WBC 13.0* 11.6* 10.4  HGB 13.0 13.0 11.7*  HCT 39.3 38.4 35.3*  MCV 93.6 93.2 94.6  PLT 211 217 206    Cardiac Enzymes:  Recent Labs Lab 07/02/13 1452 07/02/13 2036 07/03/13 0412  TROPONINI <0.30 <0.30 <0.30   MYOCARDIAL IMAGING WITH SPECT (REST AND PHARMACOLOGIC-STRESS)  GATED LEFT VENTRICULAR WALL MOTION STUDY  LEFT VENTRICULAR EJECTION FRACTION  TECHNIQUE:  Standard myocardial SPECT imaging was performed after resting  intravenous injection of 10 mCi Tc-85m sestamibi. Subsequently,  intravenous infusion of Lexiscan was performed under the supervision  of the Cardiology staff. At peak effect of the drug, 30 mCi Tc-75m  sestamibi was injected  intravenously and standard myocardial SPECT  imaging was performed. Quantitative gated imaging was also performed  to evaluate left ventricular wall motion, and estimate left  ventricular ejection fraction.  COMPARISON: DG CHEST 2 VIEW dated 07/02/2013  FINDINGS:  Rest images demonstrate mild decreased uptake within the apical  segment of the anterior lateral wall.  Stress images demonstrate mild reversibility involving the apex and  apical segment of the lateral wall.  Evaluation of wall motion is within normal limits. Ejection fraction  is estimated at 71%. End-diastolic volume of 100 cc. End systolic  volume of 29 cc.   IMPRESSION:  1. Mild reversibility involving the apex and apical segment of the  lateral wall. This is suspicious for inducible ischemia. These  results will be called to the ordering clinician or representative  by the Radiologist Assistant, and communication documented in the  PACS Dashboard.  2. Underlying fixed defect involving the apical segment of the  anterolateral wall. Question remote infarct.  3. Normal left ventricular wall motion.   Medications:   Scheduled Medications: . aspirin EC  81 mg Oral Daily  . carvedilol  3.125 mg Oral BID WC  . clindamycin  150 mg Oral 3 times per day  .  enoxaparin (LOVENOX) injection  40 mg Subcutaneous Q24H  . nicotine  21 mg Transdermal Daily  . simvastatin  20 mg Oral q1800      PRN Medications:  nitroGLYCERIN, oxyCODONE-acetaminophen   Assessment:   1. Chest pain syndrome, typical and atypical features in the setting of multiple cardiac risk factors. ECG normal and cardiac markers also normal. LVEF normal without wall motion abnormalities by echocardiogram. Myoview from yesterday raised possibility of distal to apical lateral ischemia, questionable scar in the distal anterolateral wall. LVEF normal.  2. Ongoing tobacco use. Cessation has been recommended.   3. Hypertension, blood pressure currently stable.     4. History of hyperlipidemia, statin empirically started. Current LDL 103.   5. Patient recently underwent heel spur resection and tendon surgery for achilles tendinopathy with endoscopic plantar fasciotomy and application of below the knee cast. Follows with Dr. Al CorpusHyatt.   Plan/Discussion:    Discussed stress test results with the patient. She does tell me that she had an episode where she woke up 1 month ago with thought that she was having a "heart attack." Also states that she has been under a lot of emotional stress in the last few weeks. In light of her cardiac risk factors,stress test abnormalities, and to better clarify her coronary anatomy, plan is to proceed with a cardiac catheterization tomorrow. She is in agreement to proceed.   Jonelle SidleSamuel G. Yarenis Cerino, M.D., F.A.C.C.

## 2013-07-04 NOTE — Progress Notes (Signed)
Patient ID: Ashley Bowers  female  ZOX:[id number]    DOB: 07/27/1960    DOA: 07/02/2013  PCP: Dema SeverinYORK,REGINA F, NP  Assessment/Plan: Principal Problem:   Chest pain - Currently resolved, ruled out - 2-D echo reviewed EF 55-60%, no regional wall motion abnormalities, grade 1 diastolic dysfunction - Nuclear medicine stress  2/28 showed mild reversibility involving And apical segment of the lateral wall suspicious for inducible ischemia, fixed defect in the apical segment of the entire lateral wall question remote infarct, EF 71% - cardiac cath 3/2 - Continue aspirin, Coreg, statin  Active Problems:   HTN (hypertension) currently borderline hypotension - Patient was on amlodipine, benazepril, HCTZ at home, currently on on Coreg only 3.25mg  bid.  - Per cardiology, continue coreg    Hyperlipidemia - Started on statin    Obesity, unspecified - Counseled on diet and weight control    Tobacco use disorder - Counseled on nicotine cessation    Achilles tendinitis of right lower extremity   Heel spur: RETROCALCANEAL HEEL SPUR - Continue pain control, clindamycin, DVT prophylaxis   DVT Prophylaxis: Lovenox  Code Status:  Family Communication: Discussed in detail with the patient  Disposition:  Consultants:  Cardiology  Procedures:  2-D echo   stress test  Antibiotics:  Clindamycin    Subjective: denied any chest pain or shortness of breath Postop day2 foot surgery  Objective: Weight change: 6.305 kg (13 lb 14.4 oz)  Intake/Output Summary (Last 24 hours) at 07/04/13 1231 Last data filed at 07/04/13 0900  Gross per 24 hour  Intake    600 ml  Output      0 ml  Net    600 ml   Blood pressure 145/83, pulse 80, temperature 98 F (36.7 C), temperature source Oral, resp. rate 18, height 5\' 9"  (1.753 m), weight 110.632 kg (243 lb 14.4 oz), SpO2 96.00%.  Physical Exam: General: Alert and awake, oriented x3, not in any acute distress. CVS: S1-S2 clear, no murmur rubs  or gallops Chest: clear to auscultation bilaterally, no wheezing, rales or rhonchi Abdomen: soft nontender, nondistended, normal bowel sounds  Extremities:  right lower extremity dressing intact   Lab Results: Basic Metabolic Panel:  Recent Labs Lab 07/02/13 1230 07/02/13 1655 07/03/13 0412  NA 138  --  141  K 4.2  --  3.7  CL 105  --  104  CO2 22  --  25  GLUCOSE 99  --  106*  BUN 14  --  17  CREATININE 0.69 0.66 0.78  CALCIUM 8.5  --  8.1*  MG  --  2.0  --    Liver Function Tests:  Recent Labs Lab 07/02/13 1230  AST 14  ALT 12  ALKPHOS 58  BILITOT 0.2*  PROT 6.1  ALBUMIN 3.1*   No results found for this basename: LIPASE, AMYLASE,  in the last 168 hours No results found for this basename: AMMONIA,  in the last 168 hours CBC:  Recent Labs Lab 07/02/13 1655 07/03/13 0412  WBC 11.6* 10.4  HGB 13.0 11.7*  HCT 38.4 35.3*  MCV 93.2 94.6  PLT 217 206   Cardiac Enzymes:  Recent Labs Lab 07/02/13 1452 07/02/13 2036 07/03/13 0412  TROPONINI <0.30 <0.30 <0.30   BNP: No components found with this basename: POCBNP,  CBG: No results found for this basename: GLUCAP,  in the last 168 hours   Micro Results: No results found for this or any previous visit (from the past 240 hour(s)).  Studies/Results:  Dg Chest 2 View  07/02/2013   CLINICAL DATA:  Shortness of breath and chest pain  EXAM: CHEST  2 VIEW  COMPARISON:  None.  FINDINGS: The heart size and mediastinal contours are within normal limits. Both lungs are clear. The visualized skeletal structures are unremarkable.  IMPRESSION: No active cardiopulmonary disease.   Electronically Signed   By: Sherian Rein M.D.   On: 07/02/2013 12:58   Nm Myocar Multi W/spect W/wall Motion / Ef  07/03/2013   CLINICAL DATA:  Chest pain hypertension. Hyperlipidemia. Tobacco use.  EXAM: MYOCARDIAL IMAGING WITH SPECT (REST AND PHARMACOLOGIC-STRESS)  GATED LEFT VENTRICULAR WALL MOTION STUDY  LEFT VENTRICULAR EJECTION FRACTION   TECHNIQUE: Standard myocardial SPECT imaging was performed after resting intravenous injection of 10 mCi Tc-88m sestamibi. Subsequently, intravenous infusion of Lexiscan was performed under the supervision of the Cardiology staff. At peak effect of the drug, 30 mCi Tc-67m sestamibi was injected intravenously and standard myocardial SPECT imaging was performed. Quantitative gated imaging was also performed to evaluate left ventricular wall motion, and estimate left ventricular ejection fraction.  COMPARISON:  DG CHEST 2 VIEW dated 07/02/2013  FINDINGS: Rest images demonstrate mild decreased uptake within the apical segment of the anterior lateral wall.  Stress images demonstrate mild reversibility involving the apex and apical segment of the lateral wall.  Evaluation of wall motion is within normal limits. Ejection fraction is estimated at 71%. End-diastolic volume of 100 cc. End systolic volume of 29 cc.  IMPRESSION: 1. Mild reversibility involving the apex and apical segment of the lateral wall. This is suspicious for inducible ischemia. These results will be called to the ordering clinician or representative by the Radiologist Assistant, and communication documented in the PACS Dashboard. 2. Underlying fixed defect involving the apical segment of the anterolateral wall. Question remote infarct. 3. Normal left ventricular wall motion.   Electronically Signed   By: Jeronimo Greaves M.D.   On: 07/03/2013 13:29    Medications: Scheduled Meds: . aspirin EC  81 mg Oral Daily  . carvedilol  3.125 mg Oral BID WC  . clindamycin  150 mg Oral 3 times per day  . enoxaparin (LOVENOX) injection  40 mg Subcutaneous Q24H  . nicotine  21 mg Transdermal Daily  . simvastatin  20 mg Oral q1800      LOS: 2 days   RAI,RIPUDEEP M.D. [facility] 07/04/2013, 12:31 PM Pager: [phone]  If 7PM-7AM, please contact night-coverage [email or web address] Password [email or web address]

## 2013-07-05 ENCOUNTER — Encounter (HOSPITAL_COMMUNITY)
Admission: EM | Disposition: A | Discharge status: Home / Self Care | Payer: BC Managed Care – PPO | Source: Home / Self Care | Attending: Internal Medicine

## 2013-07-05 ENCOUNTER — Encounter (HOSPITAL_COMMUNITY)
Admission: EM | Disposition: A | Discharge status: Home / Self Care | Payer: Self-pay | Source: Home / Self Care | Attending: Internal Medicine

## 2013-07-05 DIAGNOSIS — R079 Chest pain, unspecified: Secondary | ICD-10-CM | POA: Diagnosis present

## 2013-07-05 DIAGNOSIS — R9439 Abnormal result of other cardiovascular function study: Secondary | ICD-10-CM

## 2013-07-05 DIAGNOSIS — M7660 Achilles tendinitis, unspecified leg: Secondary | ICD-10-CM

## 2013-07-05 DIAGNOSIS — M7730 Calcaneal spur, unspecified foot: Secondary | ICD-10-CM

## 2013-07-05 HISTORY — PX: LEFT HEART CATHETERIZATION WITH CORONARY ANGIOGRAM: SHX5451

## 2013-07-05 LAB — BASIC METABOLIC PANEL
BUN: 13 mg/dL (ref 6–23)
CALCIUM: 8.4 mg/dL (ref 8.4–10.5)
CO2: 21 mEq/L (ref 19–32)
Chloride: 102 mEq/L (ref 96–112)
Creatinine, Ser: 0.68 mg/dL (ref 0.50–1.10)
GFR calc non Af Amer: >90 mL/min (ref >90)
Glucose, Bld: 92 mg/dL (ref 70–99)
Potassium: 4 mEq/L (ref 3.7–5.3)
Sodium: 136 mEq/L — ABNORMAL LOW (ref 137–147)

## 2013-07-05 LAB — CBC
HCT: 37.1 % (ref 36.0–46.0)
Hemoglobin: 12.3 g/dL (ref 12.0–15.0)
MCH: 31.2 pg (ref 26.0–34.0)
MCHC: 33.2 g/dL (ref 30.0–36.0)
MCV: 94.2 fL (ref 78.0–100.0)
Platelets: 193 10*3/uL (ref 150–400)
RBC: 3.94 MIL/uL (ref 3.87–5.11)
RDW: 13.7 % (ref 11.5–15.5)
WBC: 8.7 10*3/uL (ref 4.0–10.5)

## 2013-07-05 SURGERY — LEFT HEART CATHETERIZATION WITH CORONARY ANGIOGRAM
Anesthesia: LOCAL

## 2013-07-05 MED ORDER — HEPARIN SODIUM (PORCINE) 1000 UNIT/ML IJ SOLN
INTRAMUSCULAR | Status: AC
  Filled 2013-07-05: qty 1

## 2013-07-05 MED ORDER — LIDOCAINE HCL (PF) 1 % IJ SOLN
INTRAMUSCULAR | Status: AC
  Filled 2013-07-05: qty 30

## 2013-07-05 MED ORDER — ISOSORBIDE MONONITRATE 15 MG HALF TABLET: 15.0000 mg | ORAL_TABLET | Freq: Every day | ORAL | Status: DC

## 2013-07-05 MED ORDER — SODIUM CHLORIDE 0.9 % IV SOLN: 1.0000 mL/kg/h | INTRAVENOUS | Status: AC

## 2013-07-05 MED ORDER — FENTANYL CITRATE 0.05 MG/ML IJ SOLN
INTRAMUSCULAR | Status: AC
  Filled 2013-07-05: qty 2

## 2013-07-05 MED ORDER — CARVEDILOL 3.125 MG PO TABS: 3.1250 mg | ORAL_TABLET | Freq: Two times a day (BID) | ORAL | Status: DC

## 2013-07-05 MED ORDER — NITROGLYCERIN 0.2 MG/ML ON CALL CATH LAB
INTRAVENOUS | Status: AC
  Filled 2013-07-05: qty 1

## 2013-07-05 MED ORDER — IBUPROFEN 600 MG PO TABS: 600.0000 mg | ORAL_TABLET | Freq: Four times a day (QID) | ORAL | Status: DC | PRN

## 2013-07-05 MED ORDER — HEPARIN (PORCINE) IN NACL 2-0.9 UNIT/ML-% IJ SOLN
INTRAMUSCULAR | Status: AC
  Filled 2013-07-05: qty 1000

## 2013-07-05 MED ORDER — VERAPAMIL HCL 2.5 MG/ML IV SOLN
INTRAVENOUS | Status: AC
  Filled 2013-07-05: qty 2

## 2013-07-05 MED ORDER — MIDAZOLAM HCL 2 MG/2ML IJ SOLN
INTRAMUSCULAR | Status: AC
  Filled 2013-07-05: qty 2

## 2013-07-05 NOTE — Interval H&P Note (Signed)
History and Physical Interval Note:  07/05/2013 7:28 AM  Ashley Bowers  has presented today for surgery, with the diagnosis of Chest Pain & Abnormal Nuclear Stress Test. Cardiac RFs = HTN, HLD, long smoking history & FH of premature CAD. Myoview: apical, apical lateral defect  The various methods of treatment have been discussed with the patient and family. After consideration of risks, benefits and other options for treatment, the patient has consented to  Procedure(s): LEFT HEART CATHETERIZATION WITH CORONARY ANGIOGRAM (N/A) as a surgical intervention .  The patient's history has been reviewed, patient examined, no change in status, stable for surgery.  I have reviewed the patient's chart and labs.  Questions were answered to the patient's satisfaction.     Ashley Bowers  Cath Lab Visit (complete for each Cath Lab visit)  Clinical Evaluation Leading to the Procedure:   ACS: no  Non-ACS:    Anginal Classification: CCS III  Anti-ischemic medical therapy: Maximal Therapy (2 or more classes of medications)  Non-Invasive Test Results: Intermediate-risk stress test findings: cardiac mortality 1-3%/year  Prior CABG: No previous CABG

## 2013-07-05 NOTE — Discharge Summary (Signed)
Physician Discharge Summary  Patient ID: Ashley Bowers MRN: [id number] DOB/AGE: 13-Jun-1960 53 y.o.  Admit date: 07/02/2013 Discharge date: 07/05/2013  Primary Care Physician:  Dema Severin, NP  Discharge Diagnoses:    . Chest pain syndrome with typical and atypical features, moderate risk  . HTN (hypertension) . Hyperlipidemia . Obesity, unspecified . Tobacco use disorder . Achilles tendinitis of right lower extremity . Heel spur: RETROCALCANEAL HEEL SPUR  Consults: Cardiology, Dr. Diona Browner   Recommendations for Outpatient Follow-up:  Patient to followup with Dr. Al Corpus for her right foot surgery postop instructions  Allergies:   Allergies  Allergen Reactions  . Penicillins Anaphylaxis    Throat swelling, can't breathe  . Morphine And Related     Itching. But has taken Percocet and Dilaudid without adverse reaction. Vicodin causes nausea.  . Vicodin [Hydrocodone-Acetaminophen] Nausea And Vomiting    But has taken Percocet and Dilaudid without adverse reaction.     Discharge Medications:   Medication List    STOP taking these medications       hydrochlorothiazide 12.5 MG capsule  Commonly known as:  MICROZIDE      TAKE these medications       amLODipine-benazepril 10-20 MG per capsule  Commonly known as:  LOTREL  Take 1 capsule by mouth daily.     aspirin 81 MG EC tablet  Take 1 tablet (81 mg total) by mouth daily.     carvedilol 3.125 MG tablet  Commonly known as:  COREG  Take 1 tablet (3.125 mg total) by mouth 2 (two) times daily with a meal.     clindamycin 150 MG capsule  Commonly known as:  CLEOCIN  Take 1 capsule (150 mg total) by mouth every 8 (eight) hours. x1 week     ibuprofen 600 MG tablet  Commonly known as:  ADVIL,MOTRIN  Take 1 tablet (600 mg total) by mouth every 6 (six) hours as needed for headache or mild pain.     nitroGLYCERIN 0.4 MG SL tablet  Commonly known as:  NITROSTAT  Place 1 tablet (0.4 mg total) under the tongue every 5  (five) minutes x 3 doses as needed for chest pain.     omeprazole 20 MG capsule  Commonly known as:  PRILOSEC  Take 1 capsule (20 mg total) by mouth daily.     oxyCODONE-acetaminophen 5-325 MG per tablet  Commonly known as:  PERCOCET/ROXICET  Take 1-2 tablets by mouth every 4 (four) hours as needed for moderate pain or severe pain.     promethazine 12.5 MG tablet  Commonly known as:  PHENERGAN  Take 1 tablet (12.5 mg total) by mouth every 6 (six) hours as needed for nausea or vomiting.     simvastatin 20 MG tablet  Commonly known as:  ZOCOR  Take 1 tablet (20 mg total) by mouth at bedtime.         Brief H and P: For complete details please refer to admission H and P, but in briefPatty Duffy Bowers is a 53 y.o. female  With history of hypertension, hyperlipidemia, obesity, history of sleep apnea however patient states has never been formally diagnosed, extensive tobacco history, family history of coronary artery disease with acute MI in a father at age 45 presented to the ED post operatively after right foot surgery with crushing midsternal chest pain.  Patient stated that she had just undergone a rectal calcaneal heel spur resection and  tendon lysis with endoscopic plantar fasciotomy and application of below the knee cast on  the morning of admission. 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 Patient was given some nitroglycerin and aspirin per EMS with relief of her chest pain. Patient was subsequently presented to the ED. Patient denies any fevers, no chills, no dysuria, no vomiting, no change in her chronic intermittent abdominal pain, no diarrhea, no weakness, no cough, no headaches. EKG which was done showed low voltage and nonspecific T-wave abnormalities. Point-of-care cardiac markers were  negative. Comprehensive metabolic profile and albumin of 3.1 otherwise was within normal limits. CBC had a white count of 13 otherwise was within normal limits. Chest x-ray done was negative for any acute cardiopulmonary disease.   Hospital Course:   Chest pain with typical and atypical features receptors of hypertension, hyperlipidemia, is tobacco abuse, family history of CAD. Patient was admitted for further workup. She was ruled out for acute ACS.  2-D echo reviewed EF 55-60%, no regional wall motion abnormalities, grade 1 diastolic dysfunction. Cardiology was consulted and patient underwent nuclear medicine stress test which showed mild reversibility involving And apical segment of the lateral wall suspicious for inducible ischemia, fixed defect in the apical segment of the entire lateral wall question remote infarct, EF 71%. Patient underwent cardiac cath 07/05/13 which showed no radiographic evidence of significant coronary disease. It showed diffuse apical LAD disease but no stenosis. Patient was recommended aggressive risk factor modification and smoking cessation. Patient was continued on aspirin Coreg, statin she was recommended nitrate or CCB. Patient was continued on amlodipine  HTN (hypertension) currently borderline hypotension - Patient was on amlodipine, benazepril, HCTZ at home,  she was originally borderline hypotensive at the time of admission, hence outpatient medications were placed on hold. Her BP has been stable now and she will continue Coreg and lotrel.  Hyperlipidemia - Started on statin   Obesity, unspecified - Counseled on diet and weight control   Tobacco use disorder - Counseled on nicotine cessation   Achilles tendinitis of right lower extremity  Heel spur: RETROCALCANEAL HEEL SPUR  - Continue pain control, clindamycin, Follow up outpatient with Dr Al Corpus   Day of Discharge BP 126/75  Pulse 62  Temp(Src) 98.3 F (36.8 C) (Oral)  Resp 18  Ht 5\' 9"  (1.753 m)  Wt  111.2 kg (245 lb 2.4 oz)  BMI 36.19 kg/m2  SpO2 97%  Physical Exam: General: Alert and awake oriented x3 not in any acute distress. HEENT: anicteric sclera, pupils reactive to light and accommodation CVS: S1-S2 clear no murmur rubs or gallops Chest: clear to auscultation bilaterally, no wheezing rales or rhonchi Abdomen: soft nontender, nondistended, normal bowel sounds Extremities: no cyanosis, clubbing or edema noted bilaterally Neuro: Cranial nerves II-XII intact, no focal neurological deficits  General: Alert and awake, oriented x3, not in any acute distress.  CVS: S1-S2 clear, no murmur rubs or gallops  Chest: clear to auscultation bilaterally, no wheezing, rales or rhonchi  Abdomen: soft nontender, nondistended, normal bowel sounds  Extremities: right lower extremity dressing intact     The results of significant diagnostics from this hospitalization (including imaging, microbiology, ancillary and laboratory) are listed below for reference.    LAB RESULTS: Basic Metabolic Panel:  Recent Labs Lab 07/02/13 1655 07/03/13 0412 07/05/13 0502  NA  --  141 136*  K  --  3.7 4.0  CL  --  104 102  CO2  --  25 21  GLUCOSE  --  106* 92  BUN  --  17 13  CREATININE 0.66 0.78 0.68  CALCIUM  --  8.1* 8.4  MG 2.0  --   --    Liver Function Tests:  Recent Labs Lab 07/02/13 1230  AST 14  ALT 12  ALKPHOS 58  BILITOT 0.2*  PROT 6.1  ALBUMIN 3.1*   No results found for this basename: LIPASE, AMYLASE,  in the last 168 hours No results found for this basename: AMMONIA,  in the last 168 hours CBC:  Recent Labs Lab 07/03/13 0412 07/05/13 0502  WBC 10.4 8.7  HGB 11.7* 12.3  HCT 35.3* 37.1  MCV 94.6 94.2  PLT 206 193   Cardiac Enzymes:  Recent Labs Lab 07/02/13 2036 07/03/13 0412  TROPONINI <0.30 <0.30   BNP: No components found with this basename: POCBNP,  CBG: No results found for this basename: GLUCAP,  in the last 168 hours  Significant Diagnostic  Studies:  Dg Chest 2 View  07/02/2013   CLINICAL DATA:  Shortness of breath and chest pain  EXAM: CHEST  2 VIEW  COMPARISON:  None.  FINDINGS: The heart size and mediastinal contours are within normal limits. Both lungs are clear. The visualized skeletal structures are unremarkable.  IMPRESSION: No active cardiopulmonary disease.   Electronically Signed   By: Sherian ReinWei-Chen  Lin M.D.   On: 07/02/2013 12:58   Nm Myocar Multi W/spect W/wall Motion / Ef  07/03/2013   CLINICAL DATA:  Chest pain hypertension. Hyperlipidemia. Tobacco use.  EXAM: MYOCARDIAL IMAGING WITH SPECT (REST AND PHARMACOLOGIC-STRESS)  GATED LEFT VENTRICULAR WALL MOTION STUDY  LEFT VENTRICULAR EJECTION FRACTION  TECHNIQUE: Standard myocardial SPECT imaging was performed after resting intravenous injection of 10 mCi Tc-4345m sestamibi. Subsequently, intravenous infusion of Lexiscan was performed under the supervision of the Cardiology staff. At peak effect of the drug, 30 mCi Tc-1245m sestamibi was injected intravenously and standard myocardial SPECT imaging was performed. Quantitative gated imaging was also performed to evaluate left ventricular wall motion, and estimate left ventricular ejection fraction.  COMPARISON:  DG CHEST 2 VIEW dated 07/02/2013  FINDINGS: Rest images demonstrate mild decreased uptake within the apical segment of the anterior lateral wall.  Stress images demonstrate mild reversibility involving the apex and apical segment of the lateral wall.  Evaluation of wall motion is within normal limits. Ejection fraction is estimated at 71%. End-diastolic volume of 100 cc. End systolic volume of 29 cc.  IMPRESSION: 1. Mild reversibility involving the apex and apical segment of the lateral wall. This is suspicious for inducible ischemia. These results will be called to the ordering clinician or representative by the Radiologist Assistant, and communication documented in the PACS Dashboard. 2. Underlying fixed defect involving the apical  segment of the anterolateral wall. Question remote infarct. 3. Normal left ventricular wall motion.   Electronically Signed   By: Jeronimo GreavesKyle  Talbot M.D.   On: 07/03/2013 13:29    2D ECHO: Study Conclusions  - Left ventricle: The cavity size was normal. Wall thickness was normal. Systolic function was normal. The estimated ejection fraction was in the range of 55% to 60%. Wall motion was normal; there were no regional wall motion abnormalities. Doppler parameters are consistent with abnormal left ventricular relaxation (grade 1 diastolic dysfunction). - Left atrium: The atrium was mildly dilated.  Cardiac catheterization 3/2/ 2015 POST-OPERATIVE DIAGNOSIS:  No angiographic evidence of significant coronary disease to explain the positive stress test.  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 PLAN OF CARE:  Patient will return to the nursing unit for post radial cath care. Anticipate discharge once bedrest is complete  Continue aggressive risk factor modification and smoking cessation.  Consider Nitrate or CCB for possible coronary spasm related chest pain.    Disposition and Follow-up:     Discharge Orders   Future Appointments Provider Department Dept Phone   07/08/2013 10:30 AM Max Maud Deed, DPM [facility] [phone]   Future Orders Complete By Expires   Diet - low sodium heart healthy  As directed    Increase activity slowly  As directed        DISPOSITION: Home  DIET: Heart healthy diet     DISCHARGE FOLLOW-UP Follow-up Information   Follow up with Dema Severin, NP. Schedule an appointment as soon as possible for a visit in 2 weeks. (for hospital follow-up)    Contact information:   [address] [place] [address] [phone]       Time spent on Discharge: 45 minutes  Signed:   Yvonna Brun M.D. [facility] 07/05/2013, 10:06 AM Pager: [phone]

## 2013-07-05 NOTE — Discharge Instructions (Signed)
Radial Site Care °Refer to this sheet in the next few weeks. These instructions provide you with information on caring for yourself after your procedure. Your caregiver may also give you more specific instructions. Your treatment has been planned according to current medical practices, but problems sometimes occur. Call your caregiver if you have any problems or questions after your procedure. °HOME CARE INSTRUCTIONS °· You may shower the day after the procedure. Remove the bandage (dressing) and gently wash the site with plain soap and water. Gently pat the site dry. °· Do not apply powder or lotion to the site. °· Do not submerge the affected site in water for 3 to 5 days. °· Inspect the site at least twice daily. °· Do not flex or bend the affected arm for 24 hours. °· No lifting over 5 pounds (2.3 kg) for 5 days after your procedure. °· Do not drive home if you are discharged the same day of the procedure. Have someone else drive you. °· You may drive 24 hours after the procedure unless otherwise instructed by your caregiver. °· Do not operate machinery or power tools for 24 hours. °· A responsible adult should be with you for the first 24 hours after you arrive home. °What to expect: °· Any bruising will usually fade within 1 to 2 weeks. °· Blood that collects in the tissue (hematoma) may be painful to the touch. It should usually decrease in size and tenderness within 1 to 2 weeks. °SEEK IMMEDIATE MEDICAL CARE IF: °· You have unusual pain at the radial site. °· You have redness, warmth, swelling, or pain at the radial site. °· You have drainage (other than a small amount of blood on the dressing). °· You have chills. °· You have a fever or persistent symptoms for more than 72 hours. °· You have a fever and your symptoms suddenly get worse. °· Your arm becomes pale, cool, tingly, or numb. °· You have heavy bleeding from the site. Hold pressure on the site. °Document Released: 05/25/2010 Document Revised:  07/15/2011 Document Reviewed: 05/25/2010 °ExitCare® Patient Information ©2014 [employer]. °Chest Pain (Nonspecific) °It is often hard to give a specific diagnosis for the cause of chest pain. There is always a chance that your pain could be related to something serious, such as a heart attack or a blood clot in the lungs. You need to follow up with your caregiver for further evaluation. °CAUSES  °· Heartburn. °· Pneumonia or bronchitis. °· Anxiety or stress. °· Inflammation around your heart (pericarditis) or lung (pleuritis or pleurisy). °· A blood clot in the lung. °· A collapsed lung (pneumothorax). It can develop suddenly on its own (spontaneous pneumothorax) or from injury (trauma) to the chest. °· Shingles infection (herpes zoster virus). °The chest wall is composed of bones, muscles, and cartilage. Any of these can be the source of the pain. °· The bones can be bruised by injury. °· The muscles or cartilage can be strained by coughing or overwork. °· The cartilage can be affected by inflammation and become sore (costochondritis). °DIAGNOSIS  °Lab tests or other studies, such as X-rays, electrocardiography, stress testing, or cardiac imaging, may be needed to find the cause of your pain.  °TREATMENT  °· Treatment depends on what may be causing your chest pain. Treatment may include: °· Acid blockers for heartburn. °· Anti-inflammatory medicine. °· Pain medicine for inflammatory conditions. °· Antibiotics if an infection is present. °· You may be advised to change lifestyle habits. This includes stopping smoking and   avoiding alcohol, caffeine, and chocolate. °· You may be advised to keep your head raised (elevated) when sleeping. This reduces the chance of acid going backward from your stomach into your esophagus. °· Most of the time, nonspecific chest pain will improve within 2 to 3 days with rest and mild pain medicine. °HOME CARE INSTRUCTIONS  °· If antibiotics were prescribed, take your antibiotics as  directed. Finish them even if you start to feel better. °· For the next few days, avoid physical activities that bring on chest pain. Continue physical activities as directed. °· Do not smoke. °· Avoid drinking alcohol. °· Only take over-the-counter or prescription medicine for pain, discomfort, or fever as directed by your caregiver. °· Follow your caregiver's suggestions for further testing if your chest pain does not go away. °· Keep any follow-up appointments you made. If you do not go to an appointment, you could develop lasting (chronic) problems with pain. If there is any problem keeping an appointment, you must call to reschedule. °SEEK MEDICAL CARE IF:  °· You think you are having problems from the medicine you are taking. Read your medicine instructions carefully. °· Your chest pain does not go away, even after treatment. °· You develop a rash with blisters on your chest. °SEEK IMMEDIATE MEDICAL CARE IF:  °· You have increased chest pain or pain that spreads to your arm, neck, jaw, back, or abdomen. °· You develop shortness of breath, an increasing cough, or you are coughing up blood. °· You have severe back or abdominal pain, feel nauseous, or vomit. °· You develop severe weakness, fainting, or chills. °· You have a fever. °THIS IS AN EMERGENCY. Do not wait to see if the pain will go away. Get medical help at once. Call your local emergency services (911 in [place] Do not drive yourself to the hospital. °MAKE SURE YOU:  °· Understand these instructions. °· Will watch your condition. °· Will get help right away if you are not doing well or get worse. °Document Released: 01/30/2005 Document Revised: 07/15/2011 Document Reviewed: 11/26/2007 °ExitCare® Patient Information ©2014 [employer]. ° °

## 2013-07-05 NOTE — CV Procedure (Addendum)
CARDIAC CATHETERIZATION REPORT  NAME:  Ashley Bowers   MRN: [id number] DOB:  [birth year]   ADMIT DATE: 07/02/2013 Procedure Date: 07/05/2013  INTERVENTIONAL CARDIOLOGIST: Leonie Man, M.D., MS PRIMARY CARE PROVIDER: Imagene Riches, NP PRIMARY CARDIOLOGIST: Dorris Carnes, M.D.  PATIENT:  Ashley Bowers is a 53 y.o. female with long-standing smoking history as well as hypertension and dyslipidemia who presented with 2 episodes of resting chest pain. She had a stress test which healed possible apical ischemia. He is therefore referred for conclusive diagnostic cardiac catheterization.  PRE-OPERATIVE DIAGNOSIS:    Chest pain at rest  Abnormal Myoview with apical ischemia--Intermediate Risk   PROCEDURES PERFORMED:    Coronary Angiography  PROCEDURE:Consent:  Risks of procedure as well as the alternatives and risks of each were explained to the (patient/caregiver).  Consent for procedure obtained. Consent for signed by MD and patient with RN witness -- placed on chart.   PROCEDURE: The patient was brought to the [facility] in the fasting state and prepped and draped in the usual sterile fashion for Right groin or radial access. A modified Allen's test with plethysmography was performed, revealing excellent Ulnar artery collateral flow.  Sterile technique was used including antiseptics, cap, gloves, gown, hand hygiene, mask and sheet.  Skin prep: Chlorhexidine.  Time Out: Verified patient identification, verified procedure, site/side was marked, verified correct patient position, special equipment/implants available, medications/allergies/relevent history reviewed, required imaging and test results available.  Performed  Access: Right Radial Artery; 6 Fr Sheath --Seldinger technique (Angiocath Micropuncture Kit)  IA Radial Cocktail, IV Heparin 5000 Units Diagnostic:  TIG 4.0, (JL 3.5, JR 4, Angled Pigtail)  Right Coronary Artery Angiography: TIG  4.0  Left Coronary Artery Angiography: JL 35  Date patient started developing arm pain while attempting to cross the aortic valve with the pigtail catheter. The catheter was becoming less mobile in concern for possible brachial artery spasm, therefore the procedure was aborted and the catheter removed out of the body without cross the aortic valve.  TR Band:  0845 Hours, 11 mL air  Hemodynamics:  Central Aortic / Mean Pressures: 116/74 mmHg; 92 mmHg   Left Ventriculography: Not performed   Coronary Anatomy:  Left Main: Large-caliber, short vessel that bifurcates into the LAD and circumflex. Angiographically normal. LAD: Large-caliber vessel it tapers down to a small-caliber vessel around the apex. It gives rise to one proximal first diagonal branch and several small branches. The distal vessel around the apex is diffusely diseased small and diameter. The diagonal is free of significant disease.  Left Circumflex: Very large caliber vessel that courses as a large lateral OM with several small branches into very small AV groove branch. There are 2 small branches distally with one major branch it reaches the inferolateral apex. Tortuous but free of any significant disease distally.   RCA: Caliber vessel that branches distally into the Right Posterior Descending Artery and the  Right Posterior AV Groove Branch (RPAV) that are both moderate large-caliber vessels with minimal luminal irregularities. There is 1 major RPL branch.   After reviewing the initial angiography, the NO culprit lesion was identified.  Preparation were made to proceed with PCI on this lesion.  MEDICATIONS:  Anesthesia:  Local Lidocaine 4 ml  Sedation:  1 mg IV Versed, 25  mcg IV fentanyl ;   Omnipaque Contrast: 50  ml  Anticoagulation:  IV Heparin 5000 Units ;  IC Nitroglycerin: 200 mcg  PATIENT DISPOSITION:    The patient was  transferred to the PACU holding area in a hemodynamicaly stable, chest pain free  condition.  The patient tolerated the procedure well, and there were no complications.  EBL:   < 5 ml  The patient was stable before, during, and after the procedure.  POST-OPERATIVE DIAGNOSIS:    No angiographic evidence of significant coronary disease to explain the positive stress test.  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  PLAN OF CARE:  Patient will return to the nursing unit for post radial cath care. Anticipate discharge once bedrest is complete  Continue aggressive risk factor modification and smoking cessation.  Consider Nitrate or CCB for possible coronary spasm related chest pain.   Leonie Man, M.D., M.S. [facility] [address]. [place], [place]  [address]  [phone]  07/05/2013 8:42 AM

## 2013-07-05 NOTE — Progress Notes (Signed)
D/c orders received;IV removed with gauze on, pt remains in stable condition, pt meds and instructions reviewed and given to pt; reviewed radial site care with pt and shared smoking cessation info with pt as well

## 2013-07-05 NOTE — H&P (View-Only) (Signed)
Consulting cardiologist: Dr. Dietrich Pates  Subjective:    No chest pain or shortness of breath.  Objective:   Temp:  [97.9 F (36.6 C)-98 F (36.7 C)] 98 F (36.7 C) (03/01 0430) Pulse Rate:  [54-81] 80 (03/01 0430) Resp:  [17-18] 18 (03/01 0430) BP: (97-145)/(61-83) 145/83 mmHg (03/01 0430) SpO2:  [96 %-97 %] 96 % (03/01 0430) Weight:  [243 lb 14.4 oz (110.632 kg)] 243 lb 14.4 oz (110.632 kg) (03/01 0246) Last BM Date: 07/01/13  Filed Weights   07/02/13 1500 07/04/13 0246  Weight: 230 lb (104.327 kg) 243 lb 14.4 oz (110.632 kg)    Intake/Output Summary (Last 24 hours) at 07/04/13 0933 Last data filed at 07/03/13 1800  Gross per 24 hour  Intake    240 ml  Output      0 ml  Net    240 ml    Telemetry: Sinus rhythm.  Exam:  General: No distress.  Lungs: Clear, nonlabored.  Cardiac: RRR, no gallop.  Extremities: Right below the knee cast.   Lab Results:  Basic Metabolic Panel:  Recent Labs Lab 07/02/13 1230 07/02/13 1655 07/03/13 0412  NA 138  --  141  K 4.2  --  3.7  CL 105  --  104  CO2 22  --  25  GLUCOSE 99  --  106*  BUN 14  --  17  CREATININE 0.69 0.66 0.78  CALCIUM 8.5  --  8.1*  MG  --  2.0  --     CBC:  Recent Labs Lab 07/02/13 1230 07/02/13 1655 07/03/13 0412  WBC 13.0* 11.6* 10.4  HGB 13.0 13.0 11.7*  HCT 39.3 38.4 35.3*  MCV 93.6 93.2 94.6  PLT 211 217 206    Cardiac Enzymes:  Recent Labs Lab 07/02/13 1452 07/02/13 2036 07/03/13 0412  TROPONINI <0.30 <0.30 <0.30   MYOCARDIAL IMAGING WITH SPECT (REST AND PHARMACOLOGIC-STRESS)  GATED LEFT VENTRICULAR WALL MOTION STUDY  LEFT VENTRICULAR EJECTION FRACTION  TECHNIQUE:  Standard myocardial SPECT imaging was performed after resting  intravenous injection of 10 mCi Tc-85m sestamibi. Subsequently,  intravenous infusion of Lexiscan was performed under the supervision  of the Cardiology staff. At peak effect of the drug, 30 mCi Tc-75m  sestamibi was injected  intravenously and standard myocardial SPECT  imaging was performed. Quantitative gated imaging was also performed  to evaluate left ventricular wall motion, and estimate left  ventricular ejection fraction.  COMPARISON: DG CHEST 2 VIEW dated 07/02/2013  FINDINGS:  Rest images demonstrate mild decreased uptake within the apical  segment of the anterior lateral wall.  Stress images demonstrate mild reversibility involving the apex and  apical segment of the lateral wall.  Evaluation of wall motion is within normal limits. Ejection fraction  is estimated at 71%. End-diastolic volume of 100 cc. End systolic  volume of 29 cc.   IMPRESSION:  1. Mild reversibility involving the apex and apical segment of the  lateral wall. This is suspicious for inducible ischemia. These  results will be called to the ordering clinician or representative  by the Radiologist Assistant, and communication documented in the  PACS Dashboard.  2. Underlying fixed defect involving the apical segment of the  anterolateral wall. Question remote infarct.  3. Normal left ventricular wall motion.   Medications:   Scheduled Medications: . aspirin EC  81 mg Oral Daily  . carvedilol  3.125 mg Oral BID WC  . clindamycin  150 mg Oral 3 times per day  .  enoxaparin (LOVENOX) injection  40 mg Subcutaneous Q24H  . nicotine  21 mg Transdermal Daily  . simvastatin  20 mg Oral q1800      PRN Medications:  nitroGLYCERIN, oxyCODONE-acetaminophen   Assessment:   1. Chest pain syndrome, typical and atypical features in the setting of multiple cardiac risk factors. ECG normal and cardiac markers also normal. LVEF normal without wall motion abnormalities by echocardiogram. Myoview from yesterday raised possibility of distal to apical lateral ischemia, questionable scar in the distal anterolateral wall. LVEF normal.  2. Ongoing tobacco use. Cessation has been recommended.   3. Hypertension, blood pressure currently stable.     4. History of hyperlipidemia, statin empirically started. Current LDL 103.   5. Patient recently underwent heel spur resection and tendon surgery for achilles tendinopathy with endoscopic plantar fasciotomy and application of below the knee cast. Follows with Dr. Al CorpusHyatt.   Plan/Discussion:    Discussed stress test results with the patient. She does tell me that she had an episode where she woke up 1 month ago with thought that she was having a "heart attack." Also states that she has been under a lot of emotional stress in the last few weeks. In light of her cardiac risk factors,stress test abnormalities, and to better clarify her coronary anatomy, plan is to proceed with a cardiac catheterization tomorrow. She is in agreement to proceed.   Ashley SidleSamuel G. Yasheka Bowers, M.D., F.A.C.C.

## 2013-07-06 NOTE — ED Provider Notes (Signed)
Medical screening examination/treatment/procedure(s) were performed by non-physician practitioner and as supervising physician I was immediately available for consultation/collaboration.   EKG Interpretation   Date/Time:  Friday July 02 2013 12:13:25 EST Ventricular Rate:  53 PR Interval:  168 QRS Duration: 92 QT Interval:  485 QTC Calculation: 455 R Axis:   81 Text Interpretation:  Sinus rhythm Low voltage, precordial leads  Nonspecific T abnrm, anterolateral leads Minimal ST elevation, inferior  leads ED PHYSICIAN INTERPRETATION AVAILABLE IN [facility] Confirmed  by TEST, Record ([id number]) on 07/05/2013 2:26:53 PM        Candyce ChurnJohn David Kahealani Yankovich III, MD 07/06/13 [phone]

## 2013-07-08 ENCOUNTER — Ambulatory Visit (INDEPENDENT_AMBULATORY_CARE_PROVIDER_SITE_OTHER): Payer: BC Managed Care – PPO | Admitting: Podiatry

## 2013-07-08 ENCOUNTER — Encounter: Payer: Self-pay | Admitting: Podiatry

## 2013-07-08 ENCOUNTER — Ambulatory Visit (INDEPENDENT_AMBULATORY_CARE_PROVIDER_SITE_OTHER): Payer: BC Managed Care – PPO

## 2013-07-08 VITALS — BP 124/79 | HR 76 | Resp 18

## 2013-07-08 DIAGNOSIS — Z9889 Other specified postprocedural states: Secondary | ICD-10-CM

## 2013-07-08 NOTE — Progress Notes (Signed)
Dos 2.27.15 , doing well , no pain, last night was the first time it swelled , it might have been from keeping it down too long. Cast is comfortable . Right foot. Postoperatively she was having some chest pain and was transferred to come in emergency. 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 She subsequently had cardiac cath performed which was negative. She also states that she has not been taking her aspirin and I have suggested because they keep forgetting to pick them up area  Objective: Vital signs are stable she is alert and oriented x3. Cast to the right leg demonstrates that it is intact in the plantar aspect is clean. She has not been walking on this and it is not tied around the cath.  Assessment: Retrocalcaneal heel spur resection with Tina lysis of tendo Achilles right foot.  Plan: Discussed etiology pathology conservative versus surgical therapies. I highly recommend aspirin daily while we are in the cast. And I encouraged range of motion to her toes. I will followup with her in one week for a cast removal.

## 2013-07-12 NOTE — Progress Notes (Deleted)
Subjective:     Patient ID: Ashley Bowers, female   DOB: 08/07/1960, 53 y.o.   MRN: [id number]  HPI   Review of Systems     Objective:   Physical Exam     Assessment:     ***    Plan:     ***

## 2013-07-12 NOTE — Progress Notes (Signed)
Surgery performed by Dr Al CorpusHyatt - Right Retrocalcaneal Heel Spur Resection                                                      Right Achilles Tenolysis                                                      Right Endoscopic Plantar Fasciotomy                                                       With application of a cast

## 2013-07-15 ENCOUNTER — Ambulatory Visit (INDEPENDENT_AMBULATORY_CARE_PROVIDER_SITE_OTHER): Payer: BC Managed Care – PPO | Admitting: Podiatry

## 2013-07-15 VITALS — BP 81/64 | HR 79 | Temp 98.1°F | Resp 16

## 2013-07-15 DIAGNOSIS — Z9889 Other specified postprocedural states: Secondary | ICD-10-CM

## 2013-07-16 NOTE — Progress Notes (Signed)
She presents today 2 weeks status post Achilles repair and endoscopic plantar fasciotomy. Cast was intact today. She denies fever chills nausea vomiting muscle aches and pains.  Objective: Vital signs are stable alert and oriented x3. Cast was removed once removed demonstrates mild edema no erythema edema cellulitis drainage or odor. Staples are intact margins are well coapted removed every other staple. Sutures removed from the medial lateral aspect of the heel. The foot was redressed today.  Assessment: Well-healing surgical foot.  Plan: Redressed foot today put her Cam Dan HumphreysWalker she will continue nonweightbearing status I will followup with her in one week to remove the remainder of the staples. She will continue nonweightbearing status for at least another week or 2 after that

## 2013-07-23 ENCOUNTER — Encounter: Payer: Self-pay | Admitting: Podiatrist

## 2013-07-23 ENCOUNTER — Ambulatory Visit (INDEPENDENT_AMBULATORY_CARE_PROVIDER_SITE_OTHER): Payer: BC Managed Care – PPO | Admitting: Podiatrist

## 2013-07-23 DIAGNOSIS — M722 Plantar fascial fibromatosis: Secondary | ICD-10-CM

## 2013-07-23 DIAGNOSIS — M7660 Achilles tendinitis, unspecified leg: Secondary | ICD-10-CM

## 2013-07-23 DIAGNOSIS — Z9889 Other specified postprocedural states: Secondary | ICD-10-CM

## 2013-07-23 NOTE — Patient Instructions (Signed)
You may get your foot wet but use a shower chair to keep pressure off the foot.  Continue to wear your boot and use your crutches to keep weight off your foot still for 2 more weeks. You will see Dr. Al CorpusHyatt again in 2 weeks for further instructions.

## 2013-07-23 NOTE — Progress Notes (Signed)
My right foot has been doing good and not much pain and I am ready to walk and I had my surgery done on 07-02-13  Subjective: Patient presents today for her postop followup visit regarding Achilles tendon repair and endoscopic plantar fascial release all on her right foot. She states that she's doing well she's had very minimal pain and she's not taking pain medication. Her surgery was performed on 07/02/2013 by Dr. Al CorpusHyatt. She denies any local or systemic signs of infection and she states she has been compliant with wearing her boot and using crutches at all times.  Objective: Neurovascular status is intact. Incision sites are well coapted with staples and sutures in place which are removed without complication today. No redness, no drainage, no malodor, no dehiscence of wounds is present. Overall the right foot looks great with mild swelling present.  Assessment: Status post right foot surgery date of surgery 07/02/2013  Plan: Staples and sutures are removed at today's visit and a new dry sterile compressive dressing was applied. It is okay to get the foot wet in the shower but she does need to use a shower chair to keep the weight off of the foot. She is to stay in the boot with crutches for at least 2 more weeks or until Dr. Al CorpusHyatt tells her otherwise. At this point she is advised against driving until otherwise stated.

## 2013-08-05 ENCOUNTER — Encounter: Payer: Self-pay | Admitting: Podiatry

## 2013-08-05 ENCOUNTER — Ambulatory Visit: Payer: BC Managed Care – PPO | Admitting: Podiatry

## 2013-08-05 ENCOUNTER — Ambulatory Visit (INDEPENDENT_AMBULATORY_CARE_PROVIDER_SITE_OTHER): Payer: BC Managed Care – PPO

## 2013-08-05 VITALS — BP 146/83 | HR 84 | Resp 16

## 2013-08-05 DIAGNOSIS — B353 Tinea pedis: Secondary | ICD-10-CM

## 2013-08-05 DIAGNOSIS — Z9889 Other specified postprocedural states: Secondary | ICD-10-CM

## 2013-08-05 NOTE — Progress Notes (Signed)
She presents today states that she is doing very well status post Achilles tendon lysis EPF. She denies fever chills nausea vomiting muscle aches and pains.  Objective: Vital signs are stable she is alert and oriented x3 minimal edema to the foot. Mild tenderness on palpation of the plantar heel. Minimal tenderness on palpation of the posterior aspect of the calcaneus. She has good plantarflexion against resistance without complication or pain.  Assessment: Well-healing surgical foot.  Plan: I want her to convert from walking in the Cam Walker to walking a pair shoes over the next 2 weeks I will followup with her at that point.

## 2013-08-13 DIAGNOSIS — Z9889 Other specified postprocedural states: Secondary | ICD-10-CM | POA: Insufficient documentation

## 2013-08-16 ENCOUNTER — Encounter: Payer: BC Managed Care – PPO | Admitting: Internal Medicine

## 2013-08-18 ENCOUNTER — Encounter: Payer: Self-pay | Admitting: Internal Medicine

## 2013-08-19 ENCOUNTER — Encounter: Payer: Self-pay | Admitting: Podiatry

## 2013-08-19 ENCOUNTER — Ambulatory Visit (INDEPENDENT_AMBULATORY_CARE_PROVIDER_SITE_OTHER): Payer: BC Managed Care – PPO

## 2013-08-19 ENCOUNTER — Ambulatory Visit (INDEPENDENT_AMBULATORY_CARE_PROVIDER_SITE_OTHER): Payer: BC Managed Care – PPO | Admitting: Podiatry

## 2013-08-19 VITALS — BP 121/70 | HR 80 | Resp 12

## 2013-08-19 DIAGNOSIS — Z9889 Other specified postprocedural states: Secondary | ICD-10-CM

## 2013-08-19 DIAGNOSIS — M722 Plantar fascial fibromatosis: Secondary | ICD-10-CM

## 2013-08-19 MED ORDER — METHYLPREDNISOLONE (PAK) 4 MG PO TABS: ORAL_TABLET | ORAL | Status: DC

## 2013-08-19 NOTE — Progress Notes (Signed)
She presents today for followup of her EPF in her tendo Achilles repair February 27 she states she's doing very well with that however contralateral foot is starting to hurt.  Objective: Vital signs are stable she is alert and oriented x3 her right foot is gone to heal very nicely in a very short period of time and she's doing quite well with this radiographs confirm this. She does have pain on palpation to the medial continued tubercle of the left heel. Radiographically evaluation does demonstrate soft tissue increase in density at the plantar fascial calcaneal insertion site.  Assessment: Well-healing surgical foot right. Plantar fasciitis with lateral compensatory syndrome left foot.  Plan: Would allow her to get back to work is worse right foot is concerned I did inject the left heel today with Kenalog and local anesthetic to utilize the night splint to the left heel. Also put her on a Sterapred Dosepak to be followed by Maxine GlennMonica will followup with her in one month area

## 2013-09-16 ENCOUNTER — Ambulatory Visit: Payer: BC Managed Care – PPO | Admitting: Podiatry

## 2014-04-14 ENCOUNTER — Encounter (HOSPITAL_COMMUNITY): Payer: Self-pay | Admitting: Cardiology

## 2014-06-10 ENCOUNTER — Emergency Department (HOSPITAL_COMMUNITY)
Admission: EM | Admit: 2014-06-10 | Discharge: 2014-06-10 | Disposition: A | Discharge status: Home / Self Care | Payer: Self-pay | Attending: Emergency Medicine | Admitting: Emergency Medicine

## 2014-06-10 ENCOUNTER — Emergency Department (HOSPITAL_COMMUNITY): Payer: Self-pay

## 2014-06-10 ENCOUNTER — Encounter (HOSPITAL_COMMUNITY): Payer: Self-pay | Admitting: Family Medicine

## 2014-06-10 DIAGNOSIS — Z8719 Personal history of other diseases of the digestive system: Secondary | ICD-10-CM | POA: Insufficient documentation

## 2014-06-10 DIAGNOSIS — R519 Headache, unspecified: Secondary | ICD-10-CM

## 2014-06-10 DIAGNOSIS — Z79899 Other long term (current) drug therapy: Secondary | ICD-10-CM | POA: Insufficient documentation

## 2014-06-10 DIAGNOSIS — R079 Chest pain, unspecified: Secondary | ICD-10-CM

## 2014-06-10 DIAGNOSIS — E669 Obesity, unspecified: Secondary | ICD-10-CM | POA: Insufficient documentation

## 2014-06-10 DIAGNOSIS — Z720 Tobacco use: Secondary | ICD-10-CM | POA: Insufficient documentation

## 2014-06-10 DIAGNOSIS — E785 Hyperlipidemia, unspecified: Secondary | ICD-10-CM | POA: Insufficient documentation

## 2014-06-10 DIAGNOSIS — I10 Essential (primary) hypertension: Secondary | ICD-10-CM | POA: Insufficient documentation

## 2014-06-10 DIAGNOSIS — R51 Headache: Secondary | ICD-10-CM | POA: Insufficient documentation

## 2014-06-10 DIAGNOSIS — Z8739 Personal history of other diseases of the musculoskeletal system and connective tissue: Secondary | ICD-10-CM | POA: Insufficient documentation

## 2014-06-10 DIAGNOSIS — R0789 Other chest pain: Secondary | ICD-10-CM | POA: Insufficient documentation

## 2014-06-10 DIAGNOSIS — Z880 Allergy status to penicillin: Secondary | ICD-10-CM | POA: Insufficient documentation

## 2014-06-10 LAB — BASIC METABOLIC PANEL
Anion gap: 8 (ref 5–15)
BUN: 9 mg/dL (ref 6–23)
CALCIUM: 9.2 mg/dL (ref 8.4–10.5)
CHLORIDE: 102 mmol/L (ref 96–112)
CO2: 24 mmol/L (ref 19–32)
Creatinine, Ser: 0.7 mg/dL (ref 0.50–1.10)
GFR calc non Af Amer: >90 mL/min (ref >90)
Glucose, Bld: 158 mg/dL — ABNORMAL HIGH (ref 70–99)
POTASSIUM: 3.5 mmol/L (ref 3.5–5.1)
Sodium: 134 mmol/L — ABNORMAL LOW (ref 135–145)

## 2014-06-10 LAB — CBC
HEMATOCRIT: 43.5 % (ref 36.0–46.0)
Hemoglobin: 15 g/dL (ref 12.0–15.0)
MCH: 30.9 pg (ref 26.0–34.0)
MCHC: 34.5 g/dL (ref 30.0–36.0)
MCV: 89.7 fL (ref 78.0–100.0)
PLATELETS: 233 10*3/uL (ref 150–400)
RBC: 4.85 MIL/uL (ref 3.87–5.11)
RDW: 13.3 % (ref 11.5–15.5)
WBC: 10.7 10*3/uL — ABNORMAL HIGH (ref 4.0–10.5)

## 2014-06-10 LAB — I-STAT TROPONIN, ED
Troponin i, poc: 0 ng/mL (ref 0.00–0.08)
Troponin i, poc: 0 ng/mL (ref 0.00–0.08)

## 2014-06-10 LAB — BRAIN NATRIURETIC PEPTIDE: B Natriuretic Peptide: 39.4 pg/mL (ref 0.0–100.0)

## 2014-06-10 MED ORDER — DIPHENHYDRAMINE HCL 50 MG/ML IJ SOLN
50.0000 mg | Freq: Once | INTRAMUSCULAR | Status: AC
  Administered 2014-06-10: 50 mg via INTRAVENOUS
  Filled 2014-06-10: qty 1

## 2014-06-10 MED ORDER — PROCHLORPERAZINE EDISYLATE 5 MG/ML IJ SOLN
10.0000 mg | Freq: Once | INTRAMUSCULAR | Status: AC
  Administered 2014-06-10: 10 mg via INTRAVENOUS
  Filled 2014-06-10: qty 2

## 2014-06-10 MED ORDER — METOCLOPRAMIDE HCL 5 MG/ML IJ SOLN
10.0000 mg | Freq: Once | INTRAMUSCULAR | Status: AC
  Administered 2014-06-10: 10 mg via INTRAVENOUS
  Filled 2014-06-10: qty 2

## 2014-06-10 NOTE — Discharge Instructions (Signed)
Please follow up with your primary care physician in 1-2 days. If you do not have one please call the [facility] number listed above. Please read all discharge instructions and return precautions.   General Headache Without Cause A headache is pain or discomfort felt around the head or neck area. The specific cause of a headache may not be found. There are many causes and types of headaches. A few common ones are:  Tension headaches.  Migraine headaches.  Cluster headaches.  Chronic daily headaches. HOME CARE INSTRUCTIONS   Keep all follow-up appointments with your caregiver or any specialist referral.  Only take over-the-counter or prescription medicines for pain or discomfort as directed by your caregiver.  Lie down in a dark, quiet room when you have a headache.  Keep a headache journal to find out what may trigger your migraine headaches. For example, write down:  What you eat and drink.  How much sleep you get.  Any change to your diet or medicines.  Try massage or other relaxation techniques.  Put ice packs or heat on the head and neck. Use these 3 to 4 times per day for 15 to 20 minutes each time, or as needed.  Limit stress.  Sit up straight, and do not tense your muscles.  Quit smoking if you smoke.  Limit alcohol use.  Decrease the amount of caffeine you drink, or stop drinking caffeine.  Eat and sleep on a regular schedule.  Get 7 to 9 hours of sleep, or as recommended by your caregiver.  Keep lights dim if bright lights bother you and make your headaches worse. SEEK MEDICAL CARE IF:   You have problems with the medicines you were prescribed.  Your medicines are not working.  You have a change from the usual headache.  You have nausea or vomiting. SEEK IMMEDIATE MEDICAL CARE IF:   Your headache becomes severe.  You have a fever.  You have a stiff neck.  You have loss of vision.  You have muscular weakness or loss of  muscle control.  You start losing your balance or have trouble walking.  You feel faint or pass out.  You have severe symptoms that are different from your first symptoms. MAKE SURE YOU:   Understand these instructions.  Will watch your condition.  Will get help right away if you are not doing well or get worse. Document Released: 04/22/2005 Document Revised: 07/15/2011 Document Reviewed: 05/08/2011 [facility] Patient Information 2015 [place], [place]. This information is not intended to replace advice given to you by your health care provider. Make sure you discuss any questions you have with your health care provider. Chest Pain (Nonspecific) It is often hard to give a specific diagnosis for the cause of chest pain. There is always a chance that your pain could be related to something serious, such as a heart attack or a blood clot in the lungs. You need to follow up with your health care provider for further evaluation. CAUSES   Heartburn.  Pneumonia or bronchitis.  Anxiety or stress.  Inflammation around your heart (pericarditis) or lung (pleuritis or pleurisy).  A blood clot in the lung.  A collapsed lung (pneumothorax). It can develop suddenly on its own (spontaneous pneumothorax) or from trauma to the chest.  Shingles infection (herpes zoster virus). The chest wall is composed of bones, muscles, and cartilage. Any of these can be the source of the pain.  The bones can be bruised by injury.  The  muscles or cartilage can be strained by coughing or overwork.  The cartilage can be affected by inflammation and become sore (costochondritis). DIAGNOSIS  Lab tests or other studies may be needed to find the cause of your pain. Your health care provider may have you take a test called an ambulatory electrocardiogram (ECG). An ECG records your heartbeat patterns over a 24-hour period. You may also have other tests, such as:  Transthoracic echocardiogram (TTE). During  echocardiography, sound waves are used to evaluate how blood flows through your heart.  Transesophageal echocardiogram (TEE).  Cardiac monitoring. This allows your health care provider to monitor your heart rate and rhythm in real time.  Holter monitor. This is a portable device that records your heartbeat and can help diagnose heart arrhythmias. It allows your health care provider to track your heart activity for several days, if needed.  Stress tests by exercise or by giving medicine that makes the heart beat faster. TREATMENT   Treatment depends on what may be causing your chest pain. Treatment may include:  Acid blockers for heartburn.  Anti-inflammatory medicine.  Pain medicine for inflammatory conditions.  Antibiotics if an infection is present.  You may be advised to change lifestyle habits. This includes stopping smoking and avoiding alcohol, caffeine, and chocolate.  You may be advised to keep your head raised (elevated) when sleeping. This reduces the chance of acid going backward from your stomach into your esophagus. Most of the time, nonspecific chest pain will improve within 2-3 days with rest and mild pain medicine.  HOME CARE INSTRUCTIONS   If antibiotics were prescribed, take them as directed. Finish them even if you start to feel better.  For the next few days, avoid physical activities that bring on chest pain. Continue physical activities as directed.  Do not use any tobacco products, including cigarettes, chewing tobacco, or electronic cigarettes.  Avoid drinking alcohol.  Only take medicine as directed by your health care provider.  Follow your health care provider's suggestions for further testing if your chest pain does not go away.  Keep any follow-up appointments you made. If you do not go to an appointment, you could develop lasting (chronic) problems with pain. If there is any problem keeping an appointment, call to reschedule. SEEK MEDICAL CARE IF:    Your chest pain does not go away, even after treatment.  You have a rash with blisters on your chest.  You have a fever. SEEK IMMEDIATE MEDICAL CARE IF:   You have increased chest pain or pain that spreads to your arm, neck, jaw, back, or abdomen.  You have shortness of breath.  You have an increasing cough, or you cough up blood.  You have severe back or abdominal pain.  You feel nauseous or vomit.  You have severe weakness.  You faint.  You have chills. This is an emergency. Do not wait to see if the pain will go away. Get medical help at once. Call your local emergency services (911 in [place] Do not drive yourself to the hospital. MAKE SURE YOU:   Understand these instructions.  Will watch your condition.  Will get help right away if you are not doing well or get worse. Document Released: 01/30/2005 Document Revised: 04/27/2013 Document Reviewed: 11/26/2007 [facility] Patient Information 2015 [place], [place]. This information is not intended to replace advice given to you by your health care provider. Make sure you discuss any questions you have with your health care provider.

## 2014-06-10 NOTE — ED Provider Notes (Signed)
CSN: [id number]     Arrival date & time 06/10/14  1458 History   First MD Initiated Contact with Patient 06/10/14 1658     Chief Complaint  Patient presents with  . Headache  . Chest Pain     (Consider location/radiation/quality/duration/timing/severity/associated sxs/prior Treatment) HPI Comments: Patient is a 54 yo F PMHx significant for two complaints from PCP office for evaluation. Patient's first complaint is one week of gradually worsening throbbing pounding posterior headache with associated blurred vision and intermittent left hand tinging. Alleviating factors: none. Aggravating factors: none. Medications tried prior to arrival: aspirin, motrin. Patient is also complaining about central chest pain that began 2 days ago, constant with associated dyspnea on exertion. No modifying factors identified. Patient had a cardiac catheterization without significant vessel disease noted, no PCI or CABG performed. Patient just recently started taking her Lotrel three days ago after 2-3 months off.      Past Medical History  Diagnosis Date  . Hypertension   . Hyperlipidemia   . Obesity, unspecified   . Tobacco use disorder   . Achilles tendinitis of right lower extremity   . Heel spur: RETROCALCANEAL HEEL SPUR   . Diverticulosis    Past Surgical History  Procedure Laterality Date  . Abdominal hysterectomy    . Cholecystectomy    . Breast biopsy Left   . Left heart catheterization with coronary angiogram N/A 07/05/2013    Procedure: LEFT HEART CATHETERIZATION WITH CORONARY ANGIOGRAM;  Surgeon: Marykay Lex, MD;  Location: [facility] CATH LAB;  Service: Cardiovascular;  Laterality: N/A;   Family History  Problem Relation Age of Onset  . Heart disease Father     CABG ~age 1  . Heart disease Mother     "Spasmodic artery"   History  Substance Use Topics  . Smoking status: Current Every Day Smoker -- 0.50 packs/day for 35 years    Types: Cigarettes  . Smokeless tobacco: Never Used  .  Alcohol Use: No   OB History    No data available     Review of Systems  Constitutional: Negative for fever and chills.  Eyes: Positive for visual disturbance. Negative for photophobia.  Respiratory:       DOE  Cardiovascular: Positive for chest pain.  Gastrointestinal: Negative for nausea, vomiting, abdominal pain and diarrhea.  Neurological: Positive for headaches.  All other systems reviewed and are negative.     Allergies  Penicillins; Morphine and related; and Vicodin  Home Medications   Prior to Admission medications   Medication Sig Start Date End Date Taking? Authorizing Provider  amLODipine-benazepril (LOTREL) 10-20 MG per capsule Take 1 capsule by mouth daily.   Yes Historical Provider, MD  aspirin EC 81 MG EC tablet Take 1 tablet (81 mg total) by mouth daily. Patient taking differently: Take 81 mg by mouth as needed.  07/03/13  Yes Ripudeep Jenna Luo, MD  calcium carbonate (TUMS - DOSED IN MG ELEMENTAL CALCIUM) 500 MG chewable tablet Chew 1 tablet by mouth as needed for indigestion or heartburn.   Yes Historical Provider, MD  hydrochlorothiazide (HYDRODIURIL) 25 MG tablet Take 25 mg by mouth daily.  04/20/13  Yes Historical Provider, MD  carvedilol (COREG) 3.125 MG tablet Take 1 tablet (3.125 mg total) by mouth 2 (two) times daily with a meal. 07/05/13   Ripudeep Jenna Luo, MD  methylPREDNIsolone (MEDROL DOSPACK) 4 MG tablet follow package directions 08/19/13   Max T Hyatt, DPM  nitroGLYCERIN (NITROSTAT) 0.4 MG SL tablet Place 1 tablet (  0.4 mg total) under the tongue every 5 (five) minutes x 3 doses as needed for chest pain. 07/03/13   Ripudeep Jenna LuoK Rai, MD  omeprazole (PRILOSEC) 20 MG capsule Take 1 capsule (20 mg total) by mouth daily. 07/03/13   Ripudeep Jenna LuoK Rai, MD  oxyCODONE-acetaminophen (PERCOCET/ROXICET) 5-325 MG per tablet Take 1-2 tablets by mouth every 4 (four) hours as needed for moderate pain or severe pain. 07/03/13   Ripudeep Jenna LuoK Rai, MD  promethazine (PHENERGAN) 12.5 MG  tablet Take 1 tablet (12.5 mg total) by mouth every 6 (six) hours as needed for nausea or vomiting. 07/03/13   Ripudeep Jenna LuoK Rai, MD  simvastatin (ZOCOR) 20 MG tablet Take 1 tablet (20 mg total) by mouth at bedtime. 07/03/13   Ripudeep Jenna LuoK Rai, MD   BP 112/81 mmHg  Pulse 98  Temp(Src) 97.9 F (36.6 C) (Oral)  Resp 16  SpO2 100% Physical Exam  Constitutional: She is oriented to person, place, and time. She appears well-developed and well-nourished. No distress.  HENT:  Head: Normocephalic and atraumatic.  Right Ear: External ear normal.  Left Ear: External ear normal.  Nose: Nose normal.  Mouth/Throat: Oropharynx is clear and moist. No oropharyngeal exudate.  Eyes: Conjunctivae and EOM are normal. Pupils are equal, round, and reactive to light.  Neck: Normal range of motion. Neck supple.  Cardiovascular: Normal rate, regular rhythm, normal heart sounds and intact distal pulses.   Pulmonary/Chest: Effort normal and breath sounds normal. No respiratory distress.  Abdominal: Soft. There is no tenderness.  Musculoskeletal: Normal range of motion. She exhibits no edema.  Neurological: She is alert and oriented to person, place, and time. She has normal strength. No cranial nerve deficit. Gait normal. GCS eye subscore is 4. GCS verbal subscore is 5. GCS motor subscore is 6.  Sensation grossly intact.  No pronator drift.  Bilateral heel-knee-shin intact.  Skin: Skin is warm and dry. She is not diaphoretic.  Nursing note and vitals reviewed.   ED Course  Procedures (including critical care time) Medications  metoCLOPramide (REGLAN) injection 10 mg (10 mg Intravenous Given 06/10/14 1826)  diphenhydrAMINE (BENADRYL) injection 50 mg (50 mg Intravenous Given 06/10/14 1826)  prochlorperazine (COMPAZINE) injection 10 mg (10 mg Intravenous Given 06/10/14 2018)    Labs Review Labs Reviewed  CBC - Abnormal; Notable for the following:    WBC 10.7 (*)    All other components within normal limits  BASIC  METABOLIC PANEL - Abnormal; Notable for the following:    Sodium 134 (*)    Glucose, Bld 158 (*)    All other components within normal limits  BRAIN NATRIURETIC PEPTIDE  I-STAT TROPOININ, ED  I-STAT TROPOININ, ED    Imaging Review Dg Chest 2 View  06/10/2014   CLINICAL DATA:  Sternal chest pain, weakness, migraine  EXAM: CHEST  2 VIEW  COMPARISON:  07/02/2013  FINDINGS: Cardiomediastinal silhouette is stable. No acute infiltrate or pleural effusion. No pulmonary edema. Bony thorax is unremarkable.  IMPRESSION: No active cardiopulmonary disease.   Electronically Signed   By: Natasha MeadLiviu  Pop M.D.   On: 06/10/2014 16:25   Ct Head Wo Contrast  06/10/2014   CLINICAL DATA:  Initial encounter for headache for 1 week. Visual disturbance.  EXAM: CT HEAD WITHOUT CONTRAST  TECHNIQUE: Contiguous axial images were obtained from the base of the skull through the vertex without intravenous contrast.  COMPARISON:  None.  FINDINGS: Sinuses/Soft tissues: Mucosal thickening of bilateral maxillary sinuses, sphenoid sinus, and left greater than right ethmoid  air cells. Clear mastoid air cells.  Intracranial: Mild cerebral atrophy. No mass lesion, hemorrhage, hydrocephalus, acute infarct, intra-axial, or extra-axial fluid collection.  IMPRESSION: 1.  No acute intracranial abnormality. 2. Sinus disease.   Electronically Signed   By: Jeronimo Greaves M.D.   On: 06/10/2014 19:40     EKG Interpretation   Date/Time:  Friday June 10 2014 15:15:46 EST Ventricular Rate:  99 PR Interval:  154 QRS Duration: 82 QT Interval:  366 QTC Calculation: 469 R Axis:   61 Text Interpretation:  Normal sinus rhythm Normal ECG No significant change  since last tracing Confirmed by Anitra Lauth  MD, Alphonzo Lemmings ([id number]) on 06/10/2014  8:45:43 PM      7:03 PM On reevaluation patient endorses that her headache is nearly completely resolved. She states her chest pain is gone.  8:39 PM patient including room without difficulty. States headache is  resolved. Requesting to be discharged home. Discussed results of delta troponin with patient.  MDM   Final diagnoses:  Bad headache  Chest pain, atypical    Filed Vitals:   06/10/14 2104  BP: 112/81  Pulse: 98  Temp: 97.9 F (36.6 C)  Resp: 16   Afebrile, NAD, non-toxic appearing, AAOx4.   I have reviewed nursing notes, vital signs, and all appropriate lab and imaging results for this patient.  1) HA: Pt HA treated and improved while in ED.  Presentation is like pts typical HA and non concerning for [facility], ICH, Meningitis, or temporal arteritis. Pt is afebrile with no focal neuro deficits, nuchal rigidity, or change in vision. Pt is to follow up with PCP to discuss prophylactic medication. Pt verbalizes understanding and is agreeable with plan to dc.   2) CP: Patient is to be discharged with recommendation to follow up with PCP in regards to today's hospital visit. Chest pain is not likely of cardiac or pulmonary etiology d/t presentation, low suspicion for PE no hypoxia, tachycardia or tachypnea, VSS, no tracheal deviation, no JVD or new murmur, RRR, breath sounds equal bilaterally, EKG without acute abnormalities, negative troponin, and negative CXR.   3) HTN: Patient noted to be hypertensive in the emergency department, just restarting BP medications 3 days ago.  No signs of hypertensive urgency.  Discussed with patient the need for close follow-up and management by their primary care physician. Advised patient the importance of taking blood pressure medication as prescribed.  Pt has been advised to return to the ED is CP becomes exertional, associated with diaphoresis or nausea, radiates to left jaw/arm, worsens or becomes concerning in any way. Headache return precautions also given. Pt appears reliable for follow up and is agreeable to discharge.        Jeannetta Ellis, PA-C 06/10/14 2223  Gwyneth Sprout, MD 06/11/14 0003

## 2014-06-10 NOTE — ED Notes (Signed)
Per pt headache x 1 week and chest pain x a few days. sts burning in her back. sts SOB with exertion and vision issues.

## 2017-02-11 ENCOUNTER — Emergency Department (HOSPITAL_BASED_OUTPATIENT_CLINIC_OR_DEPARTMENT_OTHER): Payer: 59

## 2017-02-11 ENCOUNTER — Emergency Department (HOSPITAL_BASED_OUTPATIENT_CLINIC_OR_DEPARTMENT_OTHER)
Admission: EM | Admit: 2017-02-11 | Discharge: 2017-02-11 | Disposition: A | Discharge status: Home / Self Care | Payer: 59 | Attending: Emergency Medicine | Admitting: Emergency Medicine

## 2017-02-11 ENCOUNTER — Encounter (HOSPITAL_BASED_OUTPATIENT_CLINIC_OR_DEPARTMENT_OTHER): Payer: Self-pay | Admitting: Emergency Medicine

## 2017-02-11 DIAGNOSIS — I10 Essential (primary) hypertension: Secondary | ICD-10-CM | POA: Diagnosis not present

## 2017-02-11 DIAGNOSIS — R1030 Lower abdominal pain, unspecified: Secondary | ICD-10-CM | POA: Diagnosis not present

## 2017-02-11 DIAGNOSIS — Z79899 Other long term (current) drug therapy: Secondary | ICD-10-CM | POA: Diagnosis not present

## 2017-02-11 DIAGNOSIS — R110 Nausea: Secondary | ICD-10-CM | POA: Insufficient documentation

## 2017-02-11 DIAGNOSIS — Z7982 Long term (current) use of aspirin: Secondary | ICD-10-CM | POA: Insufficient documentation

## 2017-02-11 DIAGNOSIS — F17210 Nicotine dependence, cigarettes, uncomplicated: Secondary | ICD-10-CM | POA: Insufficient documentation

## 2017-02-11 DIAGNOSIS — R109 Unspecified abdominal pain: Secondary | ICD-10-CM | POA: Diagnosis present

## 2017-02-11 LAB — COMPREHENSIVE METABOLIC PANEL
ALBUMIN: 3.6 g/dL (ref 3.5–5.0)
ALT: 22 U/L (ref 14–54)
AST: 21 U/L (ref 15–41)
Alkaline Phosphatase: 67 U/L (ref 38–126)
Anion gap: 8 (ref 5–15)
BUN: 13 mg/dL (ref 6–20)
CALCIUM: 9 mg/dL (ref 8.9–10.3)
CHLORIDE: 104 mmol/L (ref 101–111)
CO2: 26 mmol/L (ref 22–32)
Creatinine, Ser: 0.63 mg/dL (ref 0.44–1.00)
GFR calc Af Amer: >60 mL/min (ref >60)
GFR calc non Af Amer: >60 mL/min (ref >60)
GLUCOSE: 96 mg/dL (ref 65–99)
Potassium: 3.5 mmol/L (ref 3.5–5.1)
Sodium: 138 mmol/L (ref 135–145)
Total Bilirubin: 0.3 mg/dL (ref 0.3–1.2)
Total Protein: 7 g/dL (ref 6.5–8.1)

## 2017-02-11 LAB — URINALYSIS, ROUTINE W REFLEX MICROSCOPIC
Bilirubin Urine: NEGATIVE
Glucose, UA: NEGATIVE mg/dL
HGB URINE DIPSTICK: NEGATIVE
Ketones, ur: NEGATIVE mg/dL
LEUKOCYTES UA: NEGATIVE
Nitrite: NEGATIVE
Protein, ur: NEGATIVE mg/dL
Specific Gravity, Urine: 1.01 (ref 1.005–1.030)
pH: 7 (ref 5.0–8.0)

## 2017-02-11 LAB — CBC WITH DIFFERENTIAL/PLATELET
Basophils Absolute: 0 10*3/uL (ref 0.0–0.1)
Basophils Relative: 0 %
EOS ABS: 0.2 10*3/uL (ref 0.0–0.7)
Eosinophils Relative: 3 %
HCT: 45.1 % (ref 36.0–46.0)
Hemoglobin: 15.2 g/dL — ABNORMAL HIGH (ref 12.0–15.0)
LYMPHS ABS: 2.5 10*3/uL (ref 0.7–4.0)
Lymphocytes Relative: 31 %
MCH: 30.8 pg (ref 26.0–34.0)
MCHC: 33.7 g/dL (ref 30.0–36.0)
MCV: 91.5 fL (ref 78.0–100.0)
MONO ABS: 0.5 10*3/uL (ref 0.1–1.0)
MONOS PCT: 6 %
Neutro Abs: 5 10*3/uL (ref 1.7–7.7)
Neutrophils Relative %: 60 %
PLATELETS: 225 10*3/uL (ref 150–400)
RBC: 4.93 MIL/uL (ref 3.87–5.11)
RDW: 13.8 % (ref 11.5–15.5)
WBC: 8.2 10*3/uL (ref 4.0–10.5)

## 2017-02-11 LAB — TROPONIN I: Troponin I: <0.03 ng/mL (ref <0.03)

## 2017-02-11 LAB — LIPASE, BLOOD: Lipase: 20 U/L (ref 11–51)

## 2017-02-11 MED ORDER — IOPAMIDOL (ISOVUE-300) INJECTION 61%
100.0000 mL | Freq: Once | INTRAVENOUS | Status: AC | PRN
  Administered 2017-02-11: 100 mL via INTRAVENOUS

## 2017-02-11 MED ORDER — ONDANSETRON HCL 4 MG/2ML IJ SOLN
4.0000 mg | Freq: Once | INTRAMUSCULAR | Status: AC
  Administered 2017-02-11: 4 mg via INTRAVENOUS
  Filled 2017-02-11: qty 2

## 2017-02-11 MED ORDER — FAMOTIDINE 20 MG PO TABS: 20.0000 mg | ORAL_TABLET | Freq: Two times a day (BID) | ORAL | 0 refills | Status: DC

## 2017-02-11 MED ORDER — HYDROMORPHONE HCL 1 MG/ML IJ SOLN
1.0000 mg | Freq: Once | INTRAMUSCULAR | Status: DC
  Filled 2017-02-11: qty 1

## 2017-02-11 MED ORDER — HYDROMORPHONE HCL 1 MG/ML IJ SOLN
1.0000 mg | Freq: Once | INTRAMUSCULAR | Status: AC
  Administered 2017-02-11: 1 mg via INTRAVENOUS
  Filled 2017-02-11: qty 1

## 2017-02-11 MED ORDER — HYDROMORPHONE HCL 4 MG PO TABS: 4.0000 mg | ORAL_TABLET | Freq: Four times a day (QID) | ORAL | 0 refills | Status: DC | PRN

## 2017-02-11 MED ORDER — GI COCKTAIL ~~LOC~~
30.0000 mL | Freq: Once | ORAL | Status: AC
  Administered 2017-02-11: 30 mL via ORAL
  Filled 2017-02-11: qty 30

## 2017-02-11 MED ORDER — SODIUM CHLORIDE 0.9 % IV SOLN
INTRAVENOUS | Status: DC
  Administered 2017-02-11: 1000 mL via INTRAVENOUS

## 2017-02-11 MED ORDER — SODIUM CHLORIDE 0.9 % IV BOLUS (SEPSIS)
500.0000 mL | Freq: Once | INTRAVENOUS | Status: AC
  Administered 2017-02-11: 500 mL via INTRAVENOUS

## 2017-02-11 MED FILL — HYDROmorphone HCL 4 MG TABS: 4 | 5 days supply | Qty: 14 | Fill #0

## 2017-02-11 MED FILL — FAMOTIDINE 20 MG TABLET: 20 | 15 days supply | Qty: 30 | Fill #0

## 2017-02-11 NOTE — ED Notes (Signed)
Back to room from CT  

## 2017-02-11 NOTE — ED Provider Notes (Signed)
[facility] Provider Note   CSN: [id number] Arrival date & time: 02/11/17  1017     History   Chief Complaint Chief Complaint  Patient presents with  . Abdominal Pain    HPI Ashley Bowers is a 56 y.o. female.  Patient followed by a primary care in the [place] area. Patient with complaint of bilateral lower quadrant abdominal pain ongoing for 2 weeks. Getting worse. Associated with nausea no vomiting no diarrhea. No dysuria no hematuria has had urinary frequency. Pain is nonradiating patient has a history according to her GYN of rectal and bladder prolapse. But not sure if this is causing the discomfort.      Past Medical History:  Diagnosis Date  . Achilles tendinitis of right lower extremity   . Diverticulosis   . Heel spur: RETROCALCANEAL HEEL SPUR   . Hyperlipidemia   . Hypertension   . Obesity, unspecified   . Tobacco use disorder     Patient Active Problem List   Diagnosis Date Noted  . S/P foot surgery 08/13/2013  . Chest pain 07/05/2013  . Chest pain with moderate risk of acute coronary syndrome 07/02/2013  . HTN (hypertension) 07/02/2013  . Hyperlipidemia 07/02/2013  . Obesity, unspecified 07/02/2013  . Tobacco use disorder 07/02/2013  . Achilles tendinitis of right lower extremity 07/02/2013  . Heel spur: RETROCALCANEAL HEEL SPUR 07/02/2013    Past Surgical History:  Procedure Laterality Date  . ABDOMINAL HYSTERECTOMY    . BREAST BIOPSY Left   . CHOLECYSTECTOMY    . LEFT HEART CATHETERIZATION WITH CORONARY ANGIOGRAM N/A 07/05/2013   Procedure: LEFT HEART CATHETERIZATION WITH CORONARY ANGIOGRAM;  Surgeon: Marykay Lex, MD;  Location: [facility] CATH LAB;  Service: Cardiovascular;  Laterality: N/A;    OB History    No data available       Home Medications    Prior to Admission medications   Medication Sig Start Date End Date Taking? Authorizing Provider  amLODipine-benazepril (LOTREL) 10-20 MG per capsule Take 1 capsule by mouth daily.    Yes [provider]  calcium carbonate (TUMS - DOSED IN MG ELEMENTAL CALCIUM) 500 MG chewable tablet Chew 1 tablet by mouth as needed for indigestion or heartburn.   Yes [provider]  cloNIDine (CATAPRES) 0.1 MG tablet Take 0.1 mg by mouth 2 (two) times daily.   Yes [provider]  hydrochlorothiazide (HYDRODIURIL) 25 MG tablet Take 25 mg by mouth daily.  04/20/13  Yes [provider]  aspirin EC 81 MG EC tablet Take 1 tablet (81 mg total) by mouth daily. Patient taking differently: Take 81 mg by mouth as needed.  07/03/13   Rai, Delene Ruffini, MD  carvedilol (COREG) 3.125 MG tablet Take 1 tablet (3.125 mg total) by mouth 2 (two) times daily with a meal. 07/05/13   Rai, Ripudeep K, MD  famotidine (PEPCID) 20 MG tablet Take 1 tablet (20 mg total) by mouth 2 (two) times daily. 02/11/17   Vanetta Mulders, MD  HYDROmorphone (DILAUDID) 4 MG tablet Take 1 tablet (4 mg total) by mouth every 6 (six) hours as needed for severe pain. 02/11/17   Vanetta Mulders, MD  methylPREDNIsolone (MEDROL DOSPACK) 4 MG tablet follow package directions 08/19/13   Hyatt, Max T, DPM  nitroGLYCERIN (NITROSTAT) 0.4 MG SL tablet Place 1 tablet (0.4 mg total) under the tongue every 5 (five) minutes x 3 doses as needed for chest pain. 07/03/13   Rai, Delene Ruffini, MD  omeprazole (PRILOSEC) 20 MG capsule  Take 1 capsule (20 mg total) by mouth daily. 07/03/13   Rai, Delene Ruffini, MD  oxyCODONE-acetaminophen (PERCOCET/ROXICET) 5-325 MG per tablet Take 1-2 tablets by mouth every 4 (four) hours as needed for moderate pain or severe pain. 07/03/13   Rai, Delene Ruffini, MD  promethazine (PHENERGAN) 12.5 MG tablet Take 1 tablet (12.5 mg total) by mouth every 6 (six) hours as needed for nausea or vomiting. 07/03/13   Rai, Ripudeep K, MD  simvastatin (ZOCOR) 20 MG tablet Take 1 tablet (20 mg total) by mouth at bedtime. 07/03/13   Cathren Harsh, MD    Family History Family History  Problem Relation Age of Onset  .  Heart disease Mother        "Spasmodic artery"  . Heart disease Father        CABG ~age 53    Social History Social History  Substance Use Topics  . Smoking status: Current Every Day Smoker    Packs/day: 0.50    Years: 35.00    Types: Cigarettes  . Smokeless tobacco: Never Used  . Alcohol use 1.2 oz/week    2 Cans of beer per week     Comment: daily      Allergies   Penicillins; Morphine and related; and Vicodin [hydrocodone-acetaminophen]   Review of Systems Review of Systems  Constitutional: Negative for fever.  HENT: Negative for congestion.   Eyes: Negative for redness.  Respiratory: Negative for shortness of breath.   Cardiovascular: Negative for chest pain.  Gastrointestinal: Positive for abdominal pain and nausea. Negative for diarrhea and vomiting.  Genitourinary: Positive for frequency. Negative for dysuria and hematuria.  Musculoskeletal: Negative for back pain.  Skin: Negative for rash.  Neurological: Negative for headaches.  Hematological: Does not bruise/bleed easily.  Psychiatric/Behavioral: Negative for confusion.     Physical Exam Updated Vital Signs BP 114/73 (BP Location: Left Arm)   Pulse (!) 59   Temp 97.6 F (36.4 C) (Oral)   Resp 18   Ht 1.727 m ( )   Wt 117.9 kg (260 lb)   SpO2 96%   BMI 39.53 kg/m   Physical Exam  Constitutional: She is oriented to person, place, and time. She appears well-developed and well-nourished. No distress.  HENT:  Head: Normocephalic and atraumatic.  Mouth/Throat: Oropharynx is clear and moist.  Eyes: Pupils are equal, round, and reactive to light. Conjunctivae and EOM are normal.  Neck: Normal range of motion. Neck supple.  Cardiovascular: Normal rate, regular rhythm and normal heart sounds.   Pulmonary/Chest: Effort normal and breath sounds normal.  Abdominal: Soft. Bowel sounds are normal. There is tenderness. There is no guarding.   tender to palpation lower quadrants of the abdomen.    Musculoskeletal: Normal range of motion.  Neurological: She is alert and oriented to person, place, and time. No cranial nerve deficit or sensory deficit. She exhibits normal muscle tone. Coordination normal.  Skin: Skin is warm.  Nursing note and vitals reviewed.    ED Treatments / Results  Labs (all labs ordered are listed, but only abnormal results are displayed) Labs Reviewed  CBC WITH DIFFERENTIAL/PLATELET - Abnormal; Notable for the following:       Result Value   Hemoglobin 15.2 (*)    All other components within normal limits  URINALYSIS, ROUTINE W REFLEX MICROSCOPIC  COMPREHENSIVE METABOLIC PANEL  LIPASE, BLOOD  TROPONIN I   Results for orders placed or performed during the hospital encounter of 02/11/17  Urinalysis, Routine w reflex microscopic  Result Value Ref Range   Color, Urine YELLOW YELLOW   APPearance CLEAR CLEAR   Specific Gravity, Urine 1.010 1.005 - 1.030   pH 7.0 5.0 - 8.0   Glucose, UA NEGATIVE NEGATIVE mg/dL   Hgb urine dipstick NEGATIVE NEGATIVE   Bilirubin Urine NEGATIVE NEGATIVE   Ketones, ur NEGATIVE NEGATIVE mg/dL   Protein, ur NEGATIVE NEGATIVE mg/dL   Nitrite NEGATIVE NEGATIVE   Leukocytes, UA NEGATIVE NEGATIVE  Comprehensive metabolic panel  Result Value Ref Range   Sodium 138 135 - 145 mmol/L   Potassium 3.5 3.5 - 5.1 mmol/L   Chloride 104 101 - 111 mmol/L   CO2 26 22 - 32 mmol/L   Glucose, Bld 96 65 - 99 mg/dL   BUN 13 6 - 20 mg/dL   Creatinine, Ser 0.45 0.44 - 1.00 mg/dL   Calcium 9.0 8.9 - 40.9 mg/dL   Total Protein 7.0 6.5 - 8.1 g/dL   Albumin 3.6 3.5 - 5.0 g/dL   AST 21 15 - 41 U/L   ALT 22 14 - 54 U/L   Alkaline Phosphatase 67 38 - 126 U/L   Total Bilirubin 0.3 0.3 - 1.2 mg/dL   GFR calc non Af Amer >60 >60 mL/min   GFR calc Af Amer >60 >60 mL/min   Anion gap 8 5 - 15  Lipase, blood  Result Value Ref Range   Lipase 20 11 - 51 U/L  CBC with Differential/Platelet  Result Value Ref Range   WBC 8.2 4.0 - 10.5 K/uL   RBC  4.93 3.87 - 5.11 MIL/uL   Hemoglobin 15.2 (H) 12.0 - 15.0 g/dL   HCT 81.1 91.4 - 78.2 %   MCV 91.5 78.0 - 100.0 fL   MCH 30.8 26.0 - 34.0 pg   MCHC 33.7 30.0 - 36.0 g/dL   RDW 95.6 21.3 - 08.6 %   Platelets 225 150 - 400 K/uL   Neutrophils Relative % 60 %   Neutro Abs 5.0 1.7 - 7.7 K/uL   Lymphocytes Relative 31 %   Lymphs Abs 2.5 0.7 - 4.0 K/uL   Monocytes Relative 6 %   Monocytes Absolute 0.5 0.1 - 1.0 K/uL   Eosinophils Relative 3 %   Eosinophils Absolute 0.2 0.0 - 0.7 K/uL   Basophils Relative 0 %   Basophils Absolute 0.0 0.0 - 0.1 K/uL  Troponin I  Result Value Ref Range   Troponin I <0.03 <0.03 ng/mL     EKG  EKG Interpretation  Date/Time:  Tuesday February 11 2017 10:47:45 EDT Ventricular Rate:  73 PR Interval:  176 QRS Duration: 82 QT Interval:  428 QTC Calculation: 471 R Axis:   74 Text Interpretation:  Normal sinus rhythm Septal infarct , age undetermined Abnormal ECG Confirmed by Vanetta Mulders [id number]) on 02/11/2017 10:50:30 AM       Radiology Ct Abdomen Pelvis W Contrast  Result Date: 02/11/2017 CLINICAL DATA:  56 year old female with abdominal pain and nausea for 2 weeks. EXAM: CT ABDOMEN AND PELVIS WITH CONTRAST TECHNIQUE: Multidetector CT imaging of the abdomen and pelvis was performed using the standard protocol following bolus administration of intravenous contrast. CONTRAST:  ISOVUE-300 IOPAMIDOL (ISOVUE-300) INJECTION 61% COMPARISON:  08/19/2013 FINDINGS: Lower chest: Subsegmental atelectasis versus scar identified within the lower lobes. Hepatobiliary: Previous cholecystectomy. Mild intrahepatic biliary prominence. The common bile duct measures 8 mm, image 61 of series 5. No suspicious liver abnormalities. Pancreas: Unremarkable. No pancreatic ductal dilatation or surrounding inflammatory changes. Spleen: Normal in size without focal  abnormality. Adrenals/Urinary Tract: The adrenal glands appear normal. Right kidney cyst is identified measuring 2.4  cm. Unremarkable appearance of the left kidney. No mass or hydronephrosis identified. Urinary bladder appears within normal limits. Stomach/Bowel: Stomach is within normal limits. Appendix appears normal. No evidence of bowel wall thickening, distention, or inflammatory changes. Colonic diverticulosis identified. No acute inflammation identified. Vascular/Lymphatic: Aortic atherosclerosis identified. No aneurysm. No upper abdominal adenopathy. No pelvic or inguinal adenopathy. Reproductive: Status post hysterectomy. No adnexal masses. Other: No abdominal wall hernia or abnormality. No abdominopelvic ascites. Musculoskeletal: There is degenerative disc disease identified within the thoracic and lumbar spine. Bilateral L5 pars defects identified with a anterolisthesis of L5 on S1. IMPRESSION: 1. No acute findings within the abdomen or pelvis. 2.  Aortic Atherosclerosis (ICD10-I70.0). 3. Mild increase caliber of the CBD status post cholecystectomy. 4. Lumbar spondylosis including bilateral L5 pars defects within anterolisthesis of L5 on S1. Electronically Signed   By: Signa Kell M.D.   On: 02/11/2017 14:54    Procedures Procedures (including critical care time)  Medications Ordered in ED Medications  0.9 %  sodium chloride infusion (1,000 mLs Intravenous New Bag/Given 02/11/17 1237)  HYDROmorphone (DILAUDID) injection 1 mg (not administered)  sodium chloride 0.9 % bolus 500 mL (0 mLs Intravenous Stopped 02/11/17 1232)  ondansetron (ZOFRAN) injection 4 mg (4 mg Intravenous Given 02/11/17 1140)  HYDROmorphone (DILAUDID) injection 1 mg (1 mg Intravenous Given 02/11/17 1140)  iopamidol (ISOVUE-300) 61 % injection 100 mL (100 mLs Intravenous Contrast Given 02/11/17 1430)  gi cocktail (Maalox,Lidocaine,Donnatal) (30 mLs Oral Given 02/11/17 1453)     Initial Impression / Assessment and Plan / ED Course  I have reviewed the triage vital signs and the nursing notes.  Pertinent labs & imaging results that were  available during my care of the patient were reviewed by me and considered in my medical decision making (see chart for details).    Workup for the abdominal pain without any acute findings. Will treat symptomatically with hydromorphone since she had the allergy to hydrocodone. And also will treat with Pepcid since she developed some epigastric and burping here. Follow-up with your doctor.   Final Clinical Impressions(s) / ED Diagnoses   Final diagnoses:  Lower abdominal pain    New Prescriptions New Prescriptions   FAMOTIDINE (PEPCID) 20 MG TABLET    Take 1 tablet (20 mg total) by mouth 2 (two) times daily.   HYDROMORPHONE (DILAUDID) 4 MG TABLET    Take 1 tablet (4 mg total) by mouth every 6 (six) hours as needed for severe pain.     Vanetta Mulders, MD 02/14/17 2326

## 2017-02-11 NOTE — ED Notes (Signed)
Patient transported to CT 

## 2017-02-11 NOTE — Discharge Instructions (Signed)
Workup for the lower quadrant abdominal pain without any specific findings. Recommend you follow back up with your primary care doctor. Take the hydromorphone as needed for pain. Also since she developed some epigastric discomfort while here take the Pepcid as directed for the next 2 weeks. Return for any new or worse symptoms.

## 2017-02-11 NOTE — ED Notes (Signed)
Up to BR without assist, gait steady

## 2017-02-11 NOTE — ED Notes (Signed)
Visitor came out to nursing station and informed nurse she needs more pain medicine . ED MD informed

## 2017-02-11 NOTE — ED Notes (Signed)
Up to BR for u/a 

## 2017-02-11 NOTE — ED Notes (Signed)
Also c/o of dizziness , like the room is spinning and having nausea

## 2017-02-11 NOTE — ED Notes (Signed)
ED MD informed of epigastric pain and frequent belching and BP. Will hold Dilaudid for the time being

## 2017-02-11 NOTE — ED Triage Notes (Addendum)
Lower abd pain x 2 weeks . Was seen by PCP yesterday and had xray and blood work. Was eval by OB-GYN x 1 month ago and was told bladder "has dropped"

## 2017-02-11 NOTE — ED Notes (Addendum)
Pt's SpO2 dropping to 86%. Pt encouraged to take deep breaths and SpO2 returns above 90. Will continue to monitor. Pt on continuous SpO2 monitoring.

## 2019-02-15 ENCOUNTER — Ambulatory Visit (INDEPENDENT_AMBULATORY_CARE_PROVIDER_SITE_OTHER): Payer: 59

## 2019-02-15 ENCOUNTER — Other Ambulatory Visit: Payer: Self-pay | Admitting: Podiatry

## 2019-02-15 ENCOUNTER — Other Ambulatory Visit: Payer: Self-pay

## 2019-02-15 ENCOUNTER — Ambulatory Visit (INDEPENDENT_AMBULATORY_CARE_PROVIDER_SITE_OTHER): Payer: 59 | Admitting: Podiatry

## 2019-02-15 DIAGNOSIS — R234 Changes in skin texture: Secondary | ICD-10-CM

## 2019-02-15 DIAGNOSIS — M9262 Juvenile osteochondrosis of tarsus, left ankle: Secondary | ICD-10-CM

## 2019-02-15 DIAGNOSIS — M25571 Pain in right ankle and joints of right foot: Secondary | ICD-10-CM

## 2019-02-15 DIAGNOSIS — I872 Venous insufficiency (chronic) (peripheral): Secondary | ICD-10-CM | POA: Diagnosis not present

## 2019-02-15 DIAGNOSIS — N8189 Other female genital prolapse: Secondary | ICD-10-CM | POA: Insufficient documentation

## 2019-02-15 DIAGNOSIS — M9261 Juvenile osteochondrosis of tarsus, right ankle: Secondary | ICD-10-CM

## 2019-02-15 DIAGNOSIS — L600 Ingrowing nail: Secondary | ICD-10-CM

## 2019-02-15 DIAGNOSIS — B353 Tinea pedis: Secondary | ICD-10-CM

## 2019-02-15 DIAGNOSIS — N393 Stress incontinence (female) (male): Secondary | ICD-10-CM | POA: Insufficient documentation

## 2019-02-15 DIAGNOSIS — M79676 Pain in unspecified toe(s): Secondary | ICD-10-CM

## 2019-02-15 MED ORDER — KETOCONAZOLE 2 % EX CREA: TOPICAL_CREAM | CUTANEOUS | 0 refills | Status: DC

## 2019-02-15 MED ORDER — NEOMYCIN-POLYMYXIN-HC 3.5-10000-1 OT SOLN: OTIC | 0 refills | Status: DC

## 2019-02-15 NOTE — Patient Instructions (Signed)

## 2019-02-15 NOTE — Progress Notes (Signed)
Subjective:  Patient ID: Ashley Bowers, female    DOB: 12-13-1960,  MRN: [id number]  Chief Complaint  Patient presents with  . Skin Problem    Rt bottom heel medial side skin problem x July -pt states," at [facility] the lady shaved it too low." -w/ itching Tx: cortisone -pt also c/o of Rt hallux medial side raising up and sore   . Angioedema    Rt ankle medial side swelling x 3-4 mo, no pian -worse with sitting Tx: soaking and elevation and IBU    58 y.o. female presents with the above complaint. Also complains of ingrown nail to the left great toe.    Review of Systems: Negative except as noted in the HPI. Denies N/V/F/Ch.  Past Medical History:  Diagnosis Date  . Achilles tendinitis of right lower extremity   . Diverticulosis   . Heel spur: RETROCALCANEAL HEEL SPUR   . Hyperlipidemia   . Hypertension   . Obesity, unspecified   . Tobacco use disorder     Current Outpatient Medications:  .  amLODipine (NORVASC) 10 MG tablet, , Disp: , Rfl:  .  amLODipine-benazepril (LOTREL) 10-20 MG per capsule, Take 1 capsule by mouth daily., Disp: , Rfl:  .  aspirin EC 81 MG EC tablet, Take 1 tablet (81 mg total) by mouth daily. (Patient taking differently: Take 81 mg by mouth as needed. ), Disp: 30 tablet, Rfl: 3 .  benazepril (LOTENSIN) 20 MG tablet, , Disp: , Rfl:  .  hydrochlorothiazide (HYDRODIURIL) 25 MG tablet, Take 25 mg by mouth daily. , Disp: , Rfl:  .  rosuvastatin (CRESTOR) 10 MG tablet, , Disp: , Rfl:  .  ketoconazole (NIZORAL) 2 % cream, Apply 1 fingertip amount to each foot daily., Disp: 30 g, Rfl: 0 .  neomycin-polymyxin-hydrocortisone (CORTISPORIN) OTIC solution, Apply 2 drops to the ingrown toenail site twice daily. Cover with band-aid., Disp: 10 mL, Rfl: 0  Social History   Tobacco Use  Smoking Status Current Every Day Smoker  . Packs/day: 0.50  . Years: 35.00  . Pack years: 17.50  . Types: Cigarettes  Smokeless Tobacco Never Used    Allergies  Allergen Reactions   . Penicillins Anaphylaxis    Throat swelling, can't breathe  . Morphine And Related Itching and Nausea Only    Itching. But has taken Percocet and Dilaudid without adverse reaction. Vicodin causes nausea.  . Vicodin [Hydrocodone-Acetaminophen] Nausea And Vomiting    But has taken Percocet and Dilaudid without adverse reaction.   Objective:  There were no vitals filed for this visit. There is no height or weight on file to calculate BMI. Constitutional Well developed. Well nourished.  Vascular Dorsalis pedis pulses palpable bilaterally. Posterior tibial pulses palpable bilaterally. Capillary refill normal to all digits.  No cyanosis or clubbing noted. Pedal hair growth normal.  Neurologic Normal speech. Oriented to person, place, and time. Epicritic sensation to light touch grossly present bilaterally.  Dermatologic Painful ingrowing nail at medial nail borders of the hallux nail left. No other open wounds. No skin lesions.  Orthopedic: Normal joint ROM without pain or crepitus bilaterally. No visible deformities. No bony tenderness.   Radiographs: None Assessment:   1. Ingrown nail   2. Pain around toenail   3. Haglund's deformity of left heel   4. Venous (peripheral) insufficiency   5. Fissure in skin   6. Tinea pedis of both feet    Plan:  Patient was evaluated and treated and all questions answered.  Ingrown  Nail, left -Patient elects to proceed with minor surgery to remove ingrown toenail removal today. Consent reviewed and signed by patient. -Ingrown nail excised. See procedure note. -Educated on post-procedure care including soaking. Written instructions provided and reviewed. -Patient to follow up in 2 weeks for nail check.  Procedure: Excision of Ingrown Toenail Location: Left 1st medial nail borders. Anesthesia: Lidocaine 1% plain; 1.5 mL and Marcaine 0.5% plain; 1.5 mL, digital block. Skin Prep: Betadine. Dressing: Silvadene; telfa; dry, sterile,  compression dressing. Technique: Following skin prep, the toe was exsanguinated and a tourniquet was secured at the base of the toe. The affected nail border was freed, split with a nail splitter, and excised. Chemical matrixectomy was then performed with phenol and irrigated out with alcohol. The tourniquet was then removed and sterile dressing applied. Disposition: Patient tolerated procedure well. Patient to return in 2 weeks for follow-up.    Tinea Pedis -Rx ketoconazole  Venous Infufficiency -Recc Compression socks  Fissure Skin -Recommend revitaderm cream  No follow-ups on file.

## 2019-02-23 ENCOUNTER — Other Ambulatory Visit: Payer: Self-pay | Admitting: Podiatry

## 2019-02-23 DIAGNOSIS — M9262 Juvenile osteochondrosis of tarsus, left ankle: Secondary | ICD-10-CM

## 2019-03-01 ENCOUNTER — Ambulatory Visit: Payer: 59 | Admitting: Podiatry

## 2019-08-28 ENCOUNTER — Emergency Department (HOSPITAL_COMMUNITY): Payer: 59

## 2019-08-28 ENCOUNTER — Emergency Department (HOSPITAL_COMMUNITY)
Admission: EM | Admit: 2019-08-28 | Discharge: 2019-08-29 | Disposition: A | Discharge status: Home / Self Care | Payer: 59 | Attending: Emergency Medicine | Admitting: Emergency Medicine

## 2019-08-28 ENCOUNTER — Other Ambulatory Visit: Payer: Self-pay

## 2019-08-28 ENCOUNTER — Encounter (HOSPITAL_COMMUNITY): Payer: Self-pay | Admitting: Emergency Medicine

## 2019-08-28 DIAGNOSIS — Z5321 Procedure and treatment not carried out due to patient leaving prior to being seen by health care provider: Secondary | ICD-10-CM | POA: Insufficient documentation

## 2019-08-28 DIAGNOSIS — R0789 Other chest pain: Secondary | ICD-10-CM | POA: Diagnosis present

## 2019-08-28 DIAGNOSIS — R0602 Shortness of breath: Secondary | ICD-10-CM | POA: Diagnosis not present

## 2019-08-28 LAB — BASIC METABOLIC PANEL
Anion gap: 13 (ref 5–15)
BUN: 12 mg/dL (ref 6–20)
CO2: 25 mmol/L (ref 22–32)
Calcium: 9.1 mg/dL (ref 8.9–10.3)
Chloride: 101 mmol/L (ref 98–111)
Creatinine, Ser: 0.58 mg/dL (ref 0.44–1.00)
GFR calc Af Amer: >60 mL/min (ref >60)
GFR calc non Af Amer: >60 mL/min (ref >60)
Glucose, Bld: 107 mg/dL — ABNORMAL HIGH (ref 70–99)
Potassium: 3.5 mmol/L (ref 3.5–5.1)
Sodium: 139 mmol/L (ref 135–145)

## 2019-08-28 LAB — CBC
HCT: 46.8 % — ABNORMAL HIGH (ref 36.0–46.0)
Hemoglobin: 15 g/dL (ref 12.0–15.0)
MCH: 30.9 pg (ref 26.0–34.0)
MCHC: 32.1 g/dL (ref 30.0–36.0)
MCV: 96.5 fL (ref 80.0–100.0)
Platelets: 228 10*3/uL (ref 150–400)
RBC: 4.85 MIL/uL (ref 3.87–5.11)
RDW: 12.8 % (ref 11.5–15.5)
WBC: 10.6 10*3/uL — ABNORMAL HIGH (ref 4.0–10.5)
nRBC: 0 % (ref 0.0–0.2)

## 2019-08-28 LAB — I-STAT BETA HCG BLOOD, ED (MC, WL, AP ONLY): I-stat hCG, quantitative: <5 m[IU]/mL (ref <5)

## 2019-08-28 LAB — TROPONIN I (HIGH SENSITIVITY): Troponin I (High Sensitivity): 4 ng/L (ref <18)

## 2019-08-28 MED ORDER — SODIUM CHLORIDE 0.9% FLUSH: 3.0000 mL | Freq: Once | INTRAVENOUS | Status: DC

## 2019-08-28 NOTE — ED Triage Notes (Signed)
Pt reports centralized chest pressure, "feels like something sitting on my chest."  Pt states her Dr. Riley Churches her medications and her blood pressure has been elevated for the past three weeks.  Pt states she feels SOB as well.

## 2019-11-28 ENCOUNTER — Other Ambulatory Visit: Payer: Self-pay

## 2019-11-28 ENCOUNTER — Emergency Department (HOSPITAL_BASED_OUTPATIENT_CLINIC_OR_DEPARTMENT_OTHER): Payer: 59

## 2019-11-28 ENCOUNTER — Encounter (HOSPITAL_BASED_OUTPATIENT_CLINIC_OR_DEPARTMENT_OTHER): Payer: Self-pay | Admitting: Emergency Medicine

## 2019-11-28 ENCOUNTER — Emergency Department (HOSPITAL_BASED_OUTPATIENT_CLINIC_OR_DEPARTMENT_OTHER)
Admission: EM | Admit: 2019-11-28 | Discharge: 2019-11-29 | Disposition: A | Discharge status: Home / Self Care | Payer: 59 | Attending: Emergency Medicine | Admitting: Emergency Medicine

## 2019-11-28 DIAGNOSIS — I10 Essential (primary) hypertension: Secondary | ICD-10-CM | POA: Insufficient documentation

## 2019-11-28 DIAGNOSIS — F17210 Nicotine dependence, cigarettes, uncomplicated: Secondary | ICD-10-CM | POA: Diagnosis not present

## 2019-11-28 DIAGNOSIS — Z79899 Other long term (current) drug therapy: Secondary | ICD-10-CM | POA: Diagnosis not present

## 2019-11-28 DIAGNOSIS — R0789 Other chest pain: Secondary | ICD-10-CM

## 2019-11-28 DIAGNOSIS — R079 Chest pain, unspecified: Secondary | ICD-10-CM | POA: Insufficient documentation

## 2019-11-28 DIAGNOSIS — R072 Precordial pain: Secondary | ICD-10-CM

## 2019-11-28 DIAGNOSIS — E876 Hypokalemia: Secondary | ICD-10-CM

## 2019-11-28 LAB — BASIC METABOLIC PANEL
Anion gap: 14 (ref 5–15)
BUN: 14 mg/dL (ref 6–20)
CO2: 23 mmol/L (ref 22–32)
Calcium: 9.1 mg/dL (ref 8.9–10.3)
Chloride: 101 mmol/L (ref 98–111)
Creatinine, Ser: 0.62 mg/dL (ref 0.44–1.00)
GFR calc Af Amer: >60 mL/min (ref >60)
GFR calc non Af Amer: >60 mL/min (ref >60)
Glucose, Bld: 105 mg/dL — ABNORMAL HIGH (ref 70–99)
Potassium: 3.1 mmol/L — ABNORMAL LOW (ref 3.5–5.1)
Sodium: 138 mmol/L (ref 135–145)

## 2019-11-28 LAB — CBC
HCT: 44 % (ref 36.0–46.0)
Hemoglobin: 15 g/dL (ref 12.0–15.0)
MCH: 31.6 pg (ref 26.0–34.0)
MCHC: 34.1 g/dL (ref 30.0–36.0)
MCV: 92.6 fL (ref 80.0–100.0)
Platelets: 231 10*3/uL (ref 150–400)
RBC: 4.75 MIL/uL (ref 3.87–5.11)
RDW: 13.2 % (ref 11.5–15.5)
WBC: 10.1 10*3/uL (ref 4.0–10.5)
nRBC: 0 % (ref 0.0–0.2)

## 2019-11-28 LAB — TROPONIN I (HIGH SENSITIVITY): Troponin I (High Sensitivity): 4 ng/L (ref <18)

## 2019-11-28 MED ORDER — POTASSIUM CHLORIDE CRYS ER 20 MEQ PO TBCR
40.0000 meq | EXTENDED_RELEASE_TABLET | Freq: Once | ORAL | Status: AC
  Administered 2019-11-28: 40 meq via ORAL
  Filled 2019-11-28: qty 2

## 2019-11-28 MED ORDER — SODIUM CHLORIDE 0.9% FLUSH
3.0000 mL | Freq: Once | INTRAVENOUS | Status: DC
  Filled 2019-11-28: qty 3

## 2019-11-28 MED ORDER — FAMOTIDINE 20 MG PO TABS
20.0000 mg | ORAL_TABLET | Freq: Once | ORAL | Status: AC
  Administered 2019-11-28: 20 mg via ORAL
  Filled 2019-11-28: qty 1

## 2019-11-28 MED ORDER — ACETAMINOPHEN 500 MG PO TABS
1000.0000 mg | ORAL_TABLET | Freq: Once | ORAL | Status: AC
  Administered 2019-11-28: 1000 mg via ORAL
  Filled 2019-11-28: qty 2

## 2019-11-28 MED ORDER — ALUM & MAG HYDROXIDE-SIMETH 200-200-20 MG/5ML PO SUSP
30.0000 mL | Freq: Once | ORAL | Status: AC
  Administered 2019-11-28: 30 mL via ORAL
  Filled 2019-11-28: qty 30

## 2019-11-28 NOTE — ED Triage Notes (Signed)
Pt reports chest pain, shortness of breath and back pain. States her BP monitor told her yesterday that she has an irregular HR but did not come in to ED for it. Also reports changes in her BP meds, reports BP last night 199/112.

## 2019-11-28 NOTE — ED Provider Notes (Signed)
[facility] EMERGENCY DEPARTMENT Provider Note   CSN: [id number] Arrival date & time: 11/28/19  1902     History Chief Complaint  Patient presents with  . Chest Pain  . Hypertension    Ashley Bowers is a 59 y.o. female.  Patient with hx htn presents with concern high blood pressure at home, and also states midline/mid chest pain for the past week. Symptoms occur at rest, constant, dull, non radiating, not pleuritic. Denies chest wall injury or strain. Occasional non prod cough. No sore throat. No fever or chills. No change for taste/smell. No known covid exposure. Has not had vaccine. No heartburn. No leg pain or swelling. No recent immobility, trauma, travel or surgery. No personal or fam hx pad, no hx dvt or pe. Prior cardiac cath 2015.   The history is provided by the patient.  Chest Pain Associated symptoms: no abdominal pain, no back pain, no fever, no headache, no nausea, no numbness, no vomiting and no weakness   Shortness of Breath Associated symptoms: chest pain   Associated symptoms: no abdominal pain, no fever, no headaches, no neck pain, no rash, no sore throat and no vomiting        Past Medical History:  Diagnosis Date  . Achilles tendinitis of right lower extremity   . Diverticulosis   . Heel spur: RETROCALCANEAL HEEL SPUR   . Hyperlipidemia   . Hypertension   . Obesity, unspecified   . Tobacco use disorder     Patient Active Problem List   Diagnosis Date Noted  . Pelvic floor relaxation 02/15/2019  . Female stress incontinence 02/15/2019  . S/P foot surgery 08/13/2013  . Chest pain 07/05/2013  . Chest pain with moderate risk of acute coronary syndrome 07/02/2013  . HTN (hypertension) 07/02/2013  . Hyperlipidemia 07/02/2013  . Obesity, unspecified 07/02/2013  . Tobacco use disorder 07/02/2013  . Achilles tendinitis of right lower extremity 07/02/2013  . Heel spur: RETROCALCANEAL HEEL SPUR 07/02/2013    Past Surgical History:  Procedure  Laterality Date  . ABDOMINAL HYSTERECTOMY    . BREAST BIOPSY Left   . CHOLECYSTECTOMY    . LEFT HEART CATHETERIZATION WITH CORONARY ANGIOGRAM N/A 07/05/2013   Procedure: LEFT HEART CATHETERIZATION WITH CORONARY ANGIOGRAM;  Surgeon: Marykay Lex, MD;  Location: [facility] CATH LAB;  Service: Cardiovascular;  Laterality: N/A;     OB History   No obstetric history on file.     Family History  Problem Relation Age of Onset  . Heart disease Mother        "Spasmodic artery"  . Heart disease Father        CABG ~age 70    Social History   Tobacco Use  . Smoking status: Current Every Day Smoker    Packs/day: 0.50    Years: 35.00    Pack years: 17.50    Types: Cigarettes  . Smokeless tobacco: Never Used  Substance Use Topics  . Alcohol use: Yes    Alcohol/week: 2.0 standard drinks    Types: 2 Cans of beer per week    Comment: daily   . Drug use: No    Home Medications Prior to Admission medications   Medication Sig Start Date End Date Taking? Authorizing Provider  amLODipine (NORVASC) 10 MG tablet  02/10/19   [provider]  amLODipine-benazepril (LOTREL) 10-20 MG per capsule Take 1 capsule by mouth daily.    [provider]  aspirin EC 81 MG EC tablet Take 1 tablet (  81 mg total) by mouth daily. Patient taking differently: Take 81 mg by mouth as needed.  07/03/13   Rai, Delene Ruffiniipudeep K, MD  benazepril (LOTENSIN) 20 MG tablet  02/10/19   [provider]  hydrochlorothiazide (HYDRODIURIL) 25 MG tablet Take 25 mg by mouth daily.  04/20/13   [provider]  ketoconazole (NIZORAL) 2 % cream Apply 1 fingertip amount to each foot daily. 02/15/19   Park LiterPrice, Michael J, DPM  neomycin-polymyxin-hydrocortisone (CORTISPORIN) OTIC solution Apply 2 drops to the ingrown toenail site twice daily. Cover with band-aid. 02/15/19   Park LiterPrice, Michael J, DPM  rosuvastatin (CRESTOR) 10 MG tablet  02/10/19   [provider]    Allergies    Penicillins, Morphine and  related, and Vicodin [hydrocodone-acetaminophen]  Review of Systems   Review of Systems  Constitutional: Negative for chills and fever.  HENT: Negative for sore throat.   Eyes: Negative for redness and visual disturbance.  Respiratory:       Occasional non prod cough (pt indicates same as her baseline)  Cardiovascular: Positive for chest pain.  Gastrointestinal: Negative for abdominal pain, nausea and vomiting.  Genitourinary: Negative for flank pain.  Musculoskeletal: Negative for back pain and neck pain.  Skin: Negative for rash.  Neurological: Negative for weakness, numbness and headaches.  Hematological: Does not bruise/bleed easily.  Psychiatric/Behavioral: Negative for confusion.    Physical Exam Updated Vital Signs BP (!) 134/68 (BP Location: Left Arm)   Pulse 82   Temp 98.4 F (36.9 C)   Resp 18   Ht 1.727 m (5\' 8" )   Wt (!) 120.2 kg   SpO2 100%   BMI 40.29 kg/m   Physical Exam Vitals and nursing note reviewed.  Constitutional:      Appearance: Normal appearance. She is well-developed.  HENT:     Head: Atraumatic.     Nose: Nose normal.     Mouth/Throat:     Mouth: Mucous membranes are moist.  Eyes:     General: No scleral icterus.    Conjunctiva/sclera: Conjunctivae normal.     Pupils: Pupils are equal, round, and reactive to light.  Neck:     Trachea: No tracheal deviation.  Cardiovascular:     Rate and Rhythm: Normal rate and regular rhythm.     Pulses: Normal pulses.     Heart sounds: Normal heart sounds. No murmur heard.  No friction rub. No gallop.   Pulmonary:     Effort: Pulmonary effort is normal. No respiratory distress.     Breath sounds: Normal breath sounds.  Chest:     Chest wall: Tenderness present.  Abdominal:     General: Bowel sounds are normal. There is no distension.     Palpations: Abdomen is soft.     Tenderness: There is no abdominal tenderness. There is no guarding.  Genitourinary:    Comments: No cva tenderness.    Musculoskeletal:        General: No swelling or tenderness.     Cervical back: Normal range of motion and neck supple. No rigidity. No muscular tenderness.     Right lower leg: No edema.     Left lower leg: No edema.  Skin:    General: Skin is warm and dry.     Findings: No rash.  Neurological:     Mental Status: She is alert.     Comments: Alert, speech normal. Steady gait.   Psychiatric:        Mood and Affect: Mood normal.  ED Results / Procedures / Treatments   Labs (all labs ordered are listed, but only abnormal results are displayed) Results for orders placed or performed during the hospital encounter of 11/28/19  Basic metabolic panel  Result Value Ref Range   Sodium 138 135 - 145 mmol/L   Potassium 3.1 (L) 3.5 - 5.1 mmol/L   Chloride 101 98 - 111 mmol/L   CO2 23 22 - 32 mmol/L   Glucose, Bld 105 (H) 70 - 99 mg/dL   BUN 14 6 - 20 mg/dL   Creatinine, Ser 7.00 0.44 - 1.00 mg/dL   Calcium 9.1 8.9 - 17.4 mg/dL   GFR calc non Af Amer >60 >60 mL/min   GFR calc Af Amer >60 >60 mL/min   Anion gap 14 5 - 15  CBC  Result Value Ref Range   WBC 10.1 4.0 - 10.5 K/uL   RBC 4.75 3.87 - 5.11 MIL/uL   Hemoglobin 15.0 12.0 - 15.0 g/dL   HCT 94.4 36 - 46 %   MCV 92.6 80.0 - 100.0 fL   MCH 31.6 26.0 - 34.0 pg   MCHC 34.1 30.0 - 36.0 g/dL   RDW 96.7 59.1 - 63.8 %   Platelets 231 150 - 400 K/uL   nRBC 0.0 0.0 - 0.2 %  Troponin I (High Sensitivity)  Result Value Ref Range   Troponin I (High Sensitivity) 4 <18 ng/L   DG Chest 2 View  Result Date: 11/28/2019 CLINICAL DATA:  Shortness of breath for several days with chest pain, initial encounter EXAM: CHEST - 2 VIEW COMPARISON:  08/28/2018 FINDINGS: Cardiac shadow is within normal limits. Aortic calcifications are again seen. The lungs are well aerated bilaterally. No focal infiltrate or sizable effusion is seen. No bony abnormality is seen. IMPRESSION: No active cardiopulmonary disease. Electronically Signed   By: Alcide Clever  M.D.   On: 11/28/2019 20:06    EKG EKG Interpretation  Date/Time:  Sunday November 28 2019 19:13:47 EDT Ventricular Rate:  80 PR Interval:  178 QRS Duration: 82 QT Interval:  420 QTC Calculation: 484 R Axis:   44 Text Interpretation: Normal sinus rhythm Non-specific ST-t changes No significant change since last tracing Confirmed by Cathren Laine ([id number]) on 11/28/2019 11:00:00 PM   Radiology DG Chest 2 View  Result Date: 11/28/2019 CLINICAL DATA:  Shortness of breath for several days with chest pain, initial encounter EXAM: CHEST - 2 VIEW COMPARISON:  08/28/2018 FINDINGS: Cardiac shadow is within normal limits. Aortic calcifications are again seen. The lungs are well aerated bilaterally. No focal infiltrate or sizable effusion is seen. No bony abnormality is seen. IMPRESSION: No active cardiopulmonary disease. Electronically Signed   By: Alcide Clever M.D.   On: 11/28/2019 20:06    Procedures Procedures (including critical care time)  Medications Ordered in ED Medications  sodium chloride flush (NS) 0.9 % injection 3 mL (has no administration in time range)  potassium chloride SA (KLOR-CON) CR tablet 40 mEq (has no administration in time range)  acetaminophen (TYLENOL) tablet 1,000 mg (has no administration in time range)  famotidine (PEPCID) tablet 20 mg (has no administration in time range)  alum & mag hydroxide-simeth (MAALOX/MYLANTA) 200-200-20 MG/5ML suspension 30 mL (has no administration in time range)    ED Course  I have reviewed the triage vital signs and the nursing notes.  Pertinent labs & imaging results that were available during my care of the patient were reviewed by me and considered in my medical decision making (see  chart for details).    MDM Rules/Calculators/A&P                          Labs. Ecg. Cxr.   Reviewed nursing notes and prior charts for additional history. Cardiac cath 2015 for chest pain - no significant cad seen then.   Initial labs  reviewed/interpreted by me - trop normal. After symptoms at rest, constant x 1 week, trop normal - felt not c/w ACS. Pt does have reproducible chest wall pain/tenderness, reproducing symptoms.   Pt was also concerned about high bp, but normal in ED. Pt indicates has adequate meds at home.   Additional labs reviewed/interpreted by me - k low. Pt is on hctz. kcl po. rx for home.  Will also give acetaminophen po, pepcid and maalox.  Recheck pt, comfortable, no distress.   CXR  Reviewed/interpreted by me - no pna.   Pt currently appears stable for d/c.  rec pcp/card f/u.  Return precautions provided.   Also rec covid vaccine, smoking cessation.    Final Clinical Impression(s) / ED Diagnoses Final diagnoses:  None    Rx / DC Orders ED Discharge Orders    None       Cathren Laine, MD 11/28/19 2343

## 2019-11-29 MED ORDER — POTASSIUM CHLORIDE CRYS ER 20 MEQ PO TBCR: 20.0000 meq | EXTENDED_RELEASE_TABLET | Freq: Every day | ORAL | 0 refills | Status: AC

## 2019-11-29 NOTE — Discharge Instructions (Addendum)
It was our pleasure to provide your ER care today - we hope that you feel better.  Take acetaminophen or ibuprofen as need for pain.   Continue your blood pressure medication, and limit salt intake. From today's labs, your potassium level is low (3.1) - eat plenty of fruits and vegetables, take potassium supplement as prescribed, and follow up with primary care doctor in 1 week.   Avoid any smoking. Also, please consider getting the covid vaccine as soon as possible - you have several risk factors for doing poorly if you did get covid.  For chest discomfort, also follow up with your cardiologist in the coming week - call office tomorrow to arrange follow up appt.   Return to ER if worse, new symptoms, increased trouble breathing, fevers, recurrent or persistent chest pain, or other concern.

## 2019-12-10 ENCOUNTER — Encounter: Payer: Self-pay | Admitting: Neurology

## 2020-02-29 NOTE — Progress Notes (Signed)
NEUROLOGY CONSULTATION NOTE  KIP KAUTZMAN MRN: [id number] DOB: 23-Dec-1960  Referring provider: Lucianne Lei, MD Primary care provider: Lucianne Lei, MD  Reason for consult:  Headache  HISTORY OF PRESENT ILLNESS: Ashley Bowers is a 59 year old right-handed female with HTN, HLD who presents for headache.  History supplemented by referring provider's note.  She began having headache 3 months.  It usually wakes her up in middle of the night.  It is a severe sharp  left sided posterior head pain associated with scalp tenderness.  There is associated numbness and tingling of arms and legs (although she has persistent numbness of the entire arms and legs), bilateral hip pain, shortness of breath, elevated blood pressure (200s systolic).  No associated nausea, photophobia or phonophobia.  If headache occurs during the day, there is a preceding visual aura of kaleidoscope vision in the left eye lasting few seconds.  Headaches last an hour.  They occur 2 times a week.  She went to an ophthalmologist and eye exam was okay.  No known triggers.  She has been to the cardiologist and ophthalmologist with no abnormalities found.  She reports no prior history of headaches, however her chart notes workup for headache included CT head from 06/10/2014 which was personally reviewed and was unremarkable.    Current NSAIDS/analgesics:  ASA PRN Current triptans:  none Current ergotamine:  none Current anti-emetic:  none Current muscle relaxants:  none Current Antihypertensive medications:  Benazepril, amlodipine Current Antidepressant medications:  none Current Anticonvulsant medications:  none Current anti-CGRP:  none Current Vitamins/Herbal/Supplements:  none Current Antihistamines/Decongestants:  none Other therapy:  none  Past NSAIDS/analgesics:  none Past triptans:  none Past ergotamine:  none Past anti-emetic:  promethazine Past muscle relaxants:  none Past Antihypertensive medications:  Caredilol,  HCTZ Past Antidepressant medications:  none Past Anticonvulsant medications:  none Past anti-CGRP:  none    PAST MEDICAL HISTORY: Past Medical History:  Diagnosis Date  . Achilles tendinitis of right lower extremity   . Diverticulosis   . Heel spur: RETROCALCANEAL HEEL SPUR   . Hyperlipidemia   . Hypertension   . Obesity, unspecified   . Tobacco use disorder     PAST SURGICAL HISTORY: Past Surgical History:  Procedure Laterality Date  . ABDOMINAL HYSTERECTOMY    . BREAST BIOPSY Left   . CHOLECYSTECTOMY    . LEFT HEART CATHETERIZATION WITH CORONARY ANGIOGRAM N/A 07/05/2013   Procedure: LEFT HEART CATHETERIZATION WITH CORONARY ANGIOGRAM;  Surgeon: Marykay Lex, MD;  Location: [facility] CATH LAB;  Service: Cardiovascular;  Laterality: N/A;    MEDICATIONS: Current Outpatient Medications on File Prior to Visit  Medication Sig Dispense Refill  . amLODipine (NORVASC) 10 MG tablet     . amLODipine-benazepril (LOTREL) 10-20 MG per capsule Take 1 capsule by mouth daily.    Marland Kitchen aspirin EC 81 MG EC tablet Take 1 tablet (81 mg total) by mouth daily. (Patient taking differently: Take 81 mg by mouth as needed. ) 30 tablet 3  . benazepril (LOTENSIN) 20 MG tablet     . hydrochlorothiazide (HYDRODIURIL) 25 MG tablet Take 25 mg by mouth daily.     Marland Kitchen ketoconazole (NIZORAL) 2 % cream Apply 1 fingertip amount to each foot daily. 30 g 0  . neomycin-polymyxin-hydrocortisone (CORTISPORIN) OTIC solution Apply 2 drops to the ingrown toenail site twice daily. Cover with band-aid. 10 mL 0  . potassium chloride SA (KLOR-CON) 20 MEQ tablet Take 1 tablet (20 mEq total) by mouth daily. 15  tablet 0  . rosuvastatin (CRESTOR) 10 MG tablet      No current facility-administered medications on file prior to visit.    ALLERGIES: Allergies  Allergen Reactions  . Penicillins Anaphylaxis    Throat swelling, can't breathe  . Morphine And Related Itching and Nausea Only    Itching. But has taken Percocet and Dilaudid  without adverse reaction. Vicodin causes nausea.  . Vicodin [Hydrocodone-Acetaminophen] Nausea And Vomiting    But has taken Percocet and Dilaudid without adverse reaction.    FAMILY HISTORY: Family History  Problem Relation Age of Onset  . Heart disease Mother        "Spasmodic artery"  . Heart disease Father        CABG ~age 88   SOCIAL HISTORY: Social History   Socioeconomic History  . Marital status: Legally Separated    Spouse name: Not on file  . Number of children: Not on file  . Years of education: Not on file  . Highest education level: Not on file  Occupational History    Comment: Retail at [employer]  Tobacco Use  . Smoking status: Current Every Day Smoker    Packs/day: 0.50    Years: 35.00    Pack years: 17.50    Types: Cigarettes  . Smokeless tobacco: Never Used  Substance and Sexual Activity  . Alcohol use: Yes    Alcohol/week: 2.0 standard drinks    Types: 2 Cans of beer per week    Comment: daily   . Drug use: No  . Sexual activity: Not on file  Other Topics Concern  . Not on file  Social History Narrative  . Not on file   Social Determinants of Health   Financial Resource Strain:   . Difficulty of Paying Living Expenses: Not on file  Food Insecurity:   . Worried About Programme researcher, broadcasting/film/video in the Last Year: Not on file  . Ran Out of Food in the Last Year: Not on file  Transportation Needs:   . Lack of Transportation (Medical): Not on file  . Lack of Transportation (Non-Medical): Not on file  Physical Activity:   . Days of Exercise per Week: Not on file  . Minutes of Exercise per Session: Not on file  Stress:   . Feeling of Stress : Not on file  Social Connections:   . Frequency of Communication with Friends and Family: Not on file  . Frequency of Social Gatherings with Friends and Family: Not on file  . Attends Religious Services: Not on file  . Active Member of Clubs or Organizations: Not on file  . Attends Banker  Meetings: Not on file  . Marital Status: Not on file  Intimate Partner Violence:   . Fear of Current or Ex-Partner: Not on file  . Emotionally Abused: Not on file  . Physically Abused: Not on file  . Sexually Abused: Not on file    PHYSICAL EXAM: Blood pressure 115/71, pulse 79, height 5\' 8"  (1.727 m), weight 269 lb 12.8 oz (122.4 kg), SpO2 96 %. General: No acute distress.  Patient appears well-groomed.  Head:  Normocephalic/atraumatic Eyes:  fundi examined but not visualized Neck: supple, no paraspinal tenderness, full range of motion Back: No paraspinal tenderness Heart: regular rate and rhythm Lungs: Clear to auscultation bilaterally. Vascular: No carotid bruits. Neurological Exam: Mental status: alert and oriented to person, place, and time, recent and remote memory intact, fund of knowledge intact, attention and concentration intact, speech  fluent and not dysarthric, language intact. Cranial nerves: CN I: not tested CN II: pupils equal, round and reactive to light, visual fields intact CN III, IV, VI:  full range of motion, no nystagmus, no ptosis CN V: facial sensation intact CN VII: upper and lower face symmetric CN VIII: hearing intact CN IX, X: gag intact, uvula midline CN XI: sternocleidomastoid and trapezius muscles intact CN XII: tongue midline Bulk & Tone: normal, no fasciculations. Motor:  5/5 throughout  Sensation:  Pinprick and vibration sensation intact. Deep Tendon Reflexes:  2+ throughout, toes downgoing.  Finger to nose testing:  Without dysmetria.   Heel to shin:  Without dysmetria.  Gait:  Normal station and stride.  Able to turn.  Romberg negative.  IMPRESSION: 1.  Migraine with aura, without status migrainosus, not intractable.  However, as these headaches are recent and wake her up from sleep, secondary etiology such as intracranial mass lesion or aneurysm should be evaluated. 2.  Numbness and tingling.  Unclear etiology.  No upper motor signs or  signs of myelopathy on exam.  Will focus workup on neuropathy.  PLAN: 1.  Start nortriptyline 10mg  at bedtime.  We can increase to 25mg  at bedtime in 6 weeks if needed. 2.  Check MRI of brain and MRA of head and neck 3.  Check ANA, sed rate, B12, TSH, ACE, SPEP/IFE 4.  Check NCV-EMG 5.  Limit use of pain relievers to no more than 2 days out of week to prevent risk of rebound or medication-overuse headache. 6.  Keep headache diary 7.  Follow up in 4 to 6 months.  Thank you for allowing me to take part in the care of this patient.  , DO  CC: , MD

## 2020-03-02 ENCOUNTER — Other Ambulatory Visit (INDEPENDENT_AMBULATORY_CARE_PROVIDER_SITE_OTHER): Payer: 59

## 2020-03-02 ENCOUNTER — Other Ambulatory Visit: Payer: Self-pay

## 2020-03-02 ENCOUNTER — Encounter: Payer: Self-pay | Admitting: Neurology

## 2020-03-02 ENCOUNTER — Ambulatory Visit (INDEPENDENT_AMBULATORY_CARE_PROVIDER_SITE_OTHER): Payer: 59 | Admitting: Neurology

## 2020-03-02 VITALS — BP 115/71 | HR 79 | Ht 68.0 in | Wt 269.8 lb

## 2020-03-02 DIAGNOSIS — R519 Headache, unspecified: Secondary | ICD-10-CM | POA: Diagnosis not present

## 2020-03-02 DIAGNOSIS — R200 Anesthesia of skin: Secondary | ICD-10-CM

## 2020-03-02 DIAGNOSIS — R202 Paresthesia of skin: Secondary | ICD-10-CM

## 2020-03-02 LAB — TSH: TSH: 1.97 u[IU]/mL (ref 0.35–4.50)

## 2020-03-02 LAB — SEDIMENTATION RATE: Sed Rate: 22 mm/h (ref 0–30)

## 2020-03-02 LAB — VITAMIN B12: Vitamin B-12: 164 pg/mL — ABNORMAL LOW (ref 211–911)

## 2020-03-02 MED ORDER — NORTRIPTYLINE HCL 10 MG PO CAPS: 10.0000 mg | ORAL_CAPSULE | Freq: Every day | ORAL | 5 refills | Status: DC

## 2020-03-02 NOTE — Patient Instructions (Signed)
1.  Start nortriptyline 10mg  at bedtime.  If headaches not improved in 6 weeks, contact me and we can increase dose. 2.  May use aspirin or other over the counter analgesic but Limit use of pain relievers to no more than 2 days out of week to prevent risk of rebound or medication-overuse headache. 3.  Will check MRI of brain and MRA of head and neck 4.  Check nerve conduction study of right arm and leg. 5.  Check blood work:  ANA, sed rate, B12, TSH, ACE, SPEP/IFE 6.  Further recommendations pending results 7.  Follow up 4 to 6 months.

## 2020-03-04 LAB — IMMUNOFIXATION, SERUM
IgA/Immunoglobulin A, Serum: 372 mg/dL — ABNORMAL HIGH (ref 87–352)
IgG (Immunoglobin G), Serum: 928 mg/dL (ref 586–1602)
IgM (Immunoglobulin M), Srm: 18 mg/dL — ABNORMAL LOW (ref 26–217)

## 2020-03-04 LAB — ANA W/REFLEX: Anti Nuclear Antibody (ANA): NEGATIVE

## 2020-03-06 LAB — ANGIOTENSIN CONVERTING ENZYME: Angiotensin-Converting Enzyme: <5 U/L — ABNORMAL LOW (ref 9–67)

## 2020-03-06 LAB — PROTEIN ELECTROPHORESIS, SERUM
Albumin ELP: 4 g/dL (ref 3.8–4.8)
Alpha 1: 0.3 g/dL (ref 0.2–0.3)
Alpha 2: 0.8 g/dL (ref 0.5–0.9)
Beta 2: 0.5 g/dL (ref 0.2–0.5)
Beta Globulin: 0.6 g/dL (ref 0.4–0.6)
Gamma Globulin: 1 g/dL (ref 0.8–1.7)
Total Protein: 7.2 g/dL (ref 6.1–8.1)

## 2020-03-27 ENCOUNTER — Ambulatory Visit
Admission: RE | Admit: 2020-03-27 | Discharge: 2020-03-27 | Disposition: A | Discharge status: Home / Self Care | Payer: 59 | Source: Ambulatory Visit | Attending: Neurology | Admitting: Neurology

## 2020-03-27 ENCOUNTER — Other Ambulatory Visit: Payer: Self-pay | Admitting: Neurology

## 2020-03-27 ENCOUNTER — Other Ambulatory Visit: Payer: Self-pay

## 2020-03-27 ENCOUNTER — Telehealth: Payer: Self-pay | Admitting: Neurology

## 2020-03-27 DIAGNOSIS — R519 Headache, unspecified: Secondary | ICD-10-CM

## 2020-03-27 MED ORDER — DIAZEPAM 5 MG PO TABS: ORAL_TABLET | ORAL | 0 refills | Status: AC

## 2020-03-27 NOTE — Telephone Encounter (Signed)
Reschedule MRI of brain without contrast.  I sent a prescription for diazepam 5mg  to take 40 to 60 minutes prior to MRI.  She will need a driver to and from the test.  Instead of MRA of head and neck, we can order CTA of head and neck as there is no claustrophobia with CT as it is open.

## 2020-03-27 NOTE — Telephone Encounter (Signed)
Patient was scheduled for 2 MRI's and a MRA today. Ashley Bowers was not able to complete them. After they put in the IV, Ashley Bowers passed out. Then they tried to do the MRI, but when they put the cage over her head Ashley Bowers had a panic attack. Ashley Bowers is wanting to know if there is an alternative option or if Dr Everlena Cooper could prescribe something so Ashley Bowers can get through it? Please call

## 2020-03-27 NOTE — Telephone Encounter (Signed)
Pt advise script sent in. And to call [facility] to reschedule

## 2020-04-19 ENCOUNTER — Ambulatory Visit (INDEPENDENT_AMBULATORY_CARE_PROVIDER_SITE_OTHER): Payer: 59 | Admitting: Neurology

## 2020-04-19 ENCOUNTER — Other Ambulatory Visit: Payer: Self-pay

## 2020-04-19 DIAGNOSIS — R202 Paresthesia of skin: Secondary | ICD-10-CM | POA: Diagnosis not present

## 2020-04-19 DIAGNOSIS — G5601 Carpal tunnel syndrome, right upper limb: Secondary | ICD-10-CM

## 2020-04-19 DIAGNOSIS — R200 Anesthesia of skin: Secondary | ICD-10-CM | POA: Diagnosis not present

## 2020-04-19 NOTE — Procedures (Signed)
[facility]  [address], [address]  [place], [place] [address] Tel: [phone] Fax:  [phone] Test Date:  04/19/2020  Patient: Ashley Bowers DOB: 09-01-1960 Physician: Nita Sickle, DO  Sex: Female Height: 5\' 8"  Ref Phys: , D.O.  ID#: Shon Millet   Technician:    Patient Complaints: This is a 59 year old female referred for evaluation of generalized numbness and tingling.  NCV & EMG Findings: Extensive electrodiagnostic testing of the right upper and lower extremity shows:  1. Right mixed palmar sensory response shows prolonged latency.  Right median, ulnar, sural, and superficial peroneal nerves are within normal limits. 2. Right median, ulnar, peroneal, and tibial motor responses are within normal limits. 3. Right tibial H reflex study is within normal limits. 4. There is no evidence of active or chronic motor axonal loss changes affecting any of the tested muscles.  Motor unit configuration and recruitment pattern is within normal limits.   Impression: 1. Right median neuropathy at or distal to the wrist (very mild), consistent with a clinical diagnosis of carpal tunnel syndrome.   2. There is no evidence of a sensorimotor polyneuropathy, cervical/lumbosacral radiculopathy, or myopathy affecting the right side   ___________________________ 60, DO    Nerve Conduction Studies Anti Sensory Summary Table   Stim Site NR Peak (ms) Norm Peak (ms) P-T Amp (V) Norm P-T Amp  Right Median Anti Sensory (2nd Digit)  36C  Wrist    3.5 <3.6 17.1 >15  Right Sup Peroneal Anti Sensory (Ant Lat Mall)  36C  12 cm    2.2 <4.6 11.9 >4  Right Sural Anti Sensory (Lat Mall)  36C  Calf    2.2 <4.6 11.7 >4  Right Ulnar Anti Sensory (5th Digit)  36C  Wrist    2.4 <3.1 19.5 >10   Motor Summary Table   Stim Site NR Onset (ms) Norm Onset (ms) O-P Amp (mV) Norm O-P Amp Site1 Site2 Delta-0 (ms) Dist (cm) Vel (m/s) Norm Vel (m/s)  Right Median Motor (Abd Poll  Brev)  36C  Wrist    3.1 <4.0 9.1 >6 Elbow Wrist 4.1 26.0 63 >50  Elbow    7.2  8.4         Right Peroneal Motor (Ext Dig Brev)  36C  Ankle    3.0 <6.0 5.0 >2.5 B Fib Ankle 6.5 37.0 57 >40  B Fib    9.5  5.0  Poplt B Fib 1.6 9.0 56 >40  Poplt    11.1  4.9         Right Tibial Motor (Abd Hall Brev)  36C  Ankle    2.6 <6.0 12.6 >4 Knee Ankle 8.5 44.0 52 >40  Knee    11.1  9.0         Right Ulnar Motor (Abd Dig Minimi)  36C  Wrist    2.0 <3.1 9.6 >7 B Elbow Wrist 3.5 24.0 69 >50  B Elbow    5.5  9.1  A Elbow B Elbow 1.5 10.0 67 >50  A Elbow    7.0  8.8          Comparison Summary Table   Stim Site NR Peak (ms) Norm Peak (ms) P-T Amp (V) Site1 Site2 Delta-P (ms) Norm Delta (ms)  Right Median/Ulnar Palm Comparison (Wrist - 8cm)  36C  Median Palm    1.9 <2.2 48.1 Median Palm Ulnar Palm 0.5   Ulnar Palm    1.4 <2.2 18.3  H Reflex Studies   NR H-Lat (ms) Lat Norm (ms) L-R H-Lat (ms)  Right Tibial (Gastroc)  36C     30.88 <35    EMG   Side Muscle Ins Act Fibs Psw Fasc Number Recrt Dur Dur. Amp Amp. Poly Poly. Comment  Right AntTibialis Nml Nml Nml Nml Nml Nml Nml Nml Nml Nml Nml Nml N/A  Right Gastroc Nml Nml Nml Nml Nml Nml Nml Nml Nml Nml Nml Nml N/A  Right Flex Dig Long Nml Nml Nml Nml Nml Nml Nml Nml Nml Nml Nml Nml N/A  Right RectFemoris Nml Nml Nml Nml Nml Nml Nml Nml Nml Nml Nml Nml N/A  Right GluteusMed Nml Nml Nml Nml Nml Nml Nml Nml Nml Nml Nml Nml N/A  Right 1stDorInt Nml Nml Nml Nml Nml Nml Nml Nml Nml Nml Nml Nml N/A  Right Abd Poll Brev Nml Nml Nml Nml Nml Nml Nml Nml Nml Nml Nml Nml N/A  Right PronatorTeres Nml Nml Nml Nml Nml Nml Nml Nml Nml Nml Nml Nml N/A  Right Biceps Nml Nml Nml Nml Nml Nml Nml Nml Nml Nml Nml Nml N/A  Right Triceps Nml Nml Nml Nml Nml Nml Nml Nml Nml Nml Nml Nml N/A  Right Deltoid Nml Nml Nml Nml Nml Nml Nml Nml Nml Nml Nml Nml N/A      Waveforms:

## 2020-04-20 ENCOUNTER — Ambulatory Visit: Admission: RE | Admit: 2020-04-20 | Payer: 59 | Source: Ambulatory Visit

## 2020-05-23 ENCOUNTER — Telehealth: Payer: Self-pay | Admitting: Neurology

## 2020-05-23 MED ORDER — NORTRIPTYLINE HCL 10 MG PO CAPS
10.0000 mg | ORAL_CAPSULE | Freq: Every day | ORAL | 5 refills | Status: DC
Start: 2020-05-23 — End: 2020-11-08

## 2020-05-23 NOTE — Telephone Encounter (Signed)
Refill sent.

## 2020-08-29 NOTE — Progress Notes (Deleted)
NEUROLOGY FOLLOW UP OFFICE NOTE  [name] [id number]  Assessment/Plan:   1.  Migraine with aura, without status migrainosus, not intractable 2.  Numbness/paresthesias - possibly secondary to B12 deficiency.  ***  1.  ***  Subjective:  Ashley Bowers is a 60 year old right-handed female with HTN, HLD who follows up for headache and numbness/tingling.  UPDATE: 1.  Started nortriptyline 10mg  at bedtime *** ***  MRI of brain and MRA of head and neck were ordered but was unable to proceed due to panic attack.  She was prescribed diazepam for MRI of brain. Instead of MRA, CTA of head and neck was ordered.  But it appears that ***  She underwent workup for neuropathy/paresthesias: -  03/02/2020 B12 level was low at 164 but rest of labs were unremarkable:  negative ANA, sed rate 22, TSH 1.97, ACE <5, SPEP/IFE negative for M-spike.  Advised to start B12 03/04/2020 daily. - 04/19/2020 NCV-EMG right UE/LE:  Very mild carpal tunnel syndrome but no evidence of sensorimotor polyneuropathy or cervical/lumbosacral radiculopathy.   HISTORY: She began having headache in the summer of 2021.  It usually wakes her up in middle of the night.  It is a severe sharp  left sided posterior head pain associated with scalp tenderness.  There is associated numbness and tingling of arms and legs (although she has persistent numbness of the entire arms and legs), bilateral hip pain, shortness of breath, elevated blood pressure (200s systolic).  No associated nausea, photophobia or phonophobia.  If headache occurs during the day, there is a preceding visual aura of kaleidoscope vision in the left eye lasting few seconds.  Headaches last an hour.  They occur 2 times a week.  She went to an ophthalmologist and eye exam was okay.  No known triggers.  She has been to the cardiologist and ophthalmologist with no abnormalities found.  She reports no prior history of headaches, however her chart notes workup for  headache included CT head from 06/10/2014 which was personally reviewed and was unremarkable.    Current NSAIDS/analgesics:  ASA PRN Current triptans:  none Current ergotamine:  none Current anti-emetic:  none Current muscle relaxants:  none Current Antihypertensive medications:  Benazepril, amlodipine Current Antidepressant medications:  none Current Anticonvulsant medications:  none Current anti-CGRP:  none Current Vitamins/Herbal/Supplements:  none Current Antihistamines/Decongestants:  none Other therapy:  none  Past NSAIDS/analgesics:  none Past triptans:  none Past ergotamine:  none Past anti-emetic:  promethazine Past muscle relaxants:  none Past Antihypertensive medications:  Caredilol, HCTZ Past Antidepressant medications:  none Past Anticonvulsant medications:  none Past anti-CGRP:  none  PAST MEDICAL HISTORY: Past Medical History:  Diagnosis Date  . Achilles tendinitis of right lower extremity   . Diverticulosis   . Heel spur: RETROCALCANEAL HEEL SPUR   . Hyperlipidemia   . Hypertension   . Obesity, unspecified   . Tobacco use disorder     MEDICATIONS: Current Outpatient Medications on File Prior to Visit  Medication Sig Dispense Refill  . albuterol (VENTOLIN HFA) 108 (90 Base) MCG/ACT inhaler     . amLODipine (NORVASC) 10 MG tablet     . amLODipine-benazepril (LOTREL) 10-20 MG per capsule Take 1 capsule by mouth daily.    08/09/2014 aspirin EC 81 MG EC tablet Take 1 tablet (81 mg total) by mouth daily. (Patient not taking: Reported on 03/02/2020) 30 tablet 3  . benazepril (LOTENSIN) 20 MG tablet     . busPIRone (BUSPAR) 10 MG tablet  Take 10 mg by mouth 2 (two) times daily.    . cloNIDine (CATAPRES) 0.2 MG tablet Take 0.2 mg by mouth 2 (two) times daily.    . diazepam (VALIUM) 5 MG tablet Take 40 to 60 minutes prior to MRI 1 tablet 0  . hydrochlorothiazide (HYDRODIURIL) 25 MG tablet Take 25 mg by mouth daily.     Marland Kitchen ketoconazole (NIZORAL) 2 % cream Apply 1 fingertip  amount to each foot daily. (Patient not taking: Reported on 03/02/2020) 30 g 0  . montelukast (SINGULAIR) 10 MG tablet Take 10 mg by mouth daily.    Marland Kitchen neomycin-polymyxin-hydrocortisone (CORTISPORIN) OTIC solution Apply 2 drops to the ingrown toenail site twice daily. Cover with band-aid. (Patient not taking: Reported on 03/02/2020) 10 mL 0  . nortriptyline (PAMELOR) 10 MG capsule Take 1 capsule (10 mg total) by mouth at bedtime. 30 capsule 5  . omeprazole (PRILOSEC) 40 MG capsule Take 40 mg by mouth daily.    . potassium chloride SA (KLOR-CON) 20 MEQ tablet Take 1 tablet (20 mEq total) by mouth daily. 15 tablet 0  . rosuvastatin (CRESTOR) 10 MG tablet      No current facility-administered medications on file prior to visit.    ALLERGIES: Allergies  Allergen Reactions  . Penicillins Anaphylaxis    Throat swelling, can't breathe  . Morphine And Related Itching and Nausea Only    Itching. But has taken Percocet and Dilaudid without adverse reaction. Vicodin causes nausea.  . Vicodin [Hydrocodone-Acetaminophen] Nausea And Vomiting    But has taken Percocet and Dilaudid without adverse reaction.    FAMILY HISTORY: Family History  Problem Relation Age of Onset  . Heart disease Mother        "Spasmodic artery"  . Heart disease Father        CABG ~age 72      Objective:  *** General: No acute distress.  Patient appears well-groomed.   Head:  Normocephalic/atraumatic Eyes:  Fundi examined but not visualized Neck: supple, no paraspinal tenderness, full range of motion Heart:  Regular rate and rhythm Lungs:  Clear to auscultation bilaterally Back: No paraspinal tenderness Neurological Exam: alert and oriented to person, place, and time. Attention span and concentration intact, recent and remote memory intact, fund of knowledge intact.  Speech fluent and not dysarthric, language intact.  CN II-XII intact. Bulk and tone normal, muscle strength 5/5 throughout.  Sensation to light touch,  temperature and vibration intact.  Deep tendon reflexes 2+ throughout, toes downgoing.  Finger to nose and heel to shin testing intact.  Gait normal, Romberg negative.     Shon Millet, DO  CC: Lucianne Lei, MD

## 2020-08-31 ENCOUNTER — Ambulatory Visit: Payer: 59 | Admitting: Neurology

## 2020-09-16 ENCOUNTER — Other Ambulatory Visit: Payer: Self-pay | Admitting: Neurology

## 2020-10-19 ENCOUNTER — Ambulatory Visit: Payer: 59 | Admitting: Neurology

## 2020-11-07 ENCOUNTER — Other Ambulatory Visit: Payer: Self-pay | Admitting: Neurology

## 2020-11-08 ENCOUNTER — Other Ambulatory Visit: Payer: Self-pay | Admitting: Neurology

## 2020-11-08 NOTE — Telephone Encounter (Signed)
Patient is overdue for follow-up appointment

## 2020-12-29 ENCOUNTER — Other Ambulatory Visit: Payer: Self-pay | Admitting: Neurology

## 2020-12-29 NOTE — Telephone Encounter (Signed)
Pt to call and schedule a visit for further refills.

## 2021-02-02 IMAGING — DX DG CHEST 2V
2 series · 2 of 2 positions shown · non-contrast
Comparison: 08/28/2018

CLINICAL DATA: Shortness of breath for several days with chest
pain, initial encounter

EXAM:
CHEST - 2 VIEW

[chest pa]
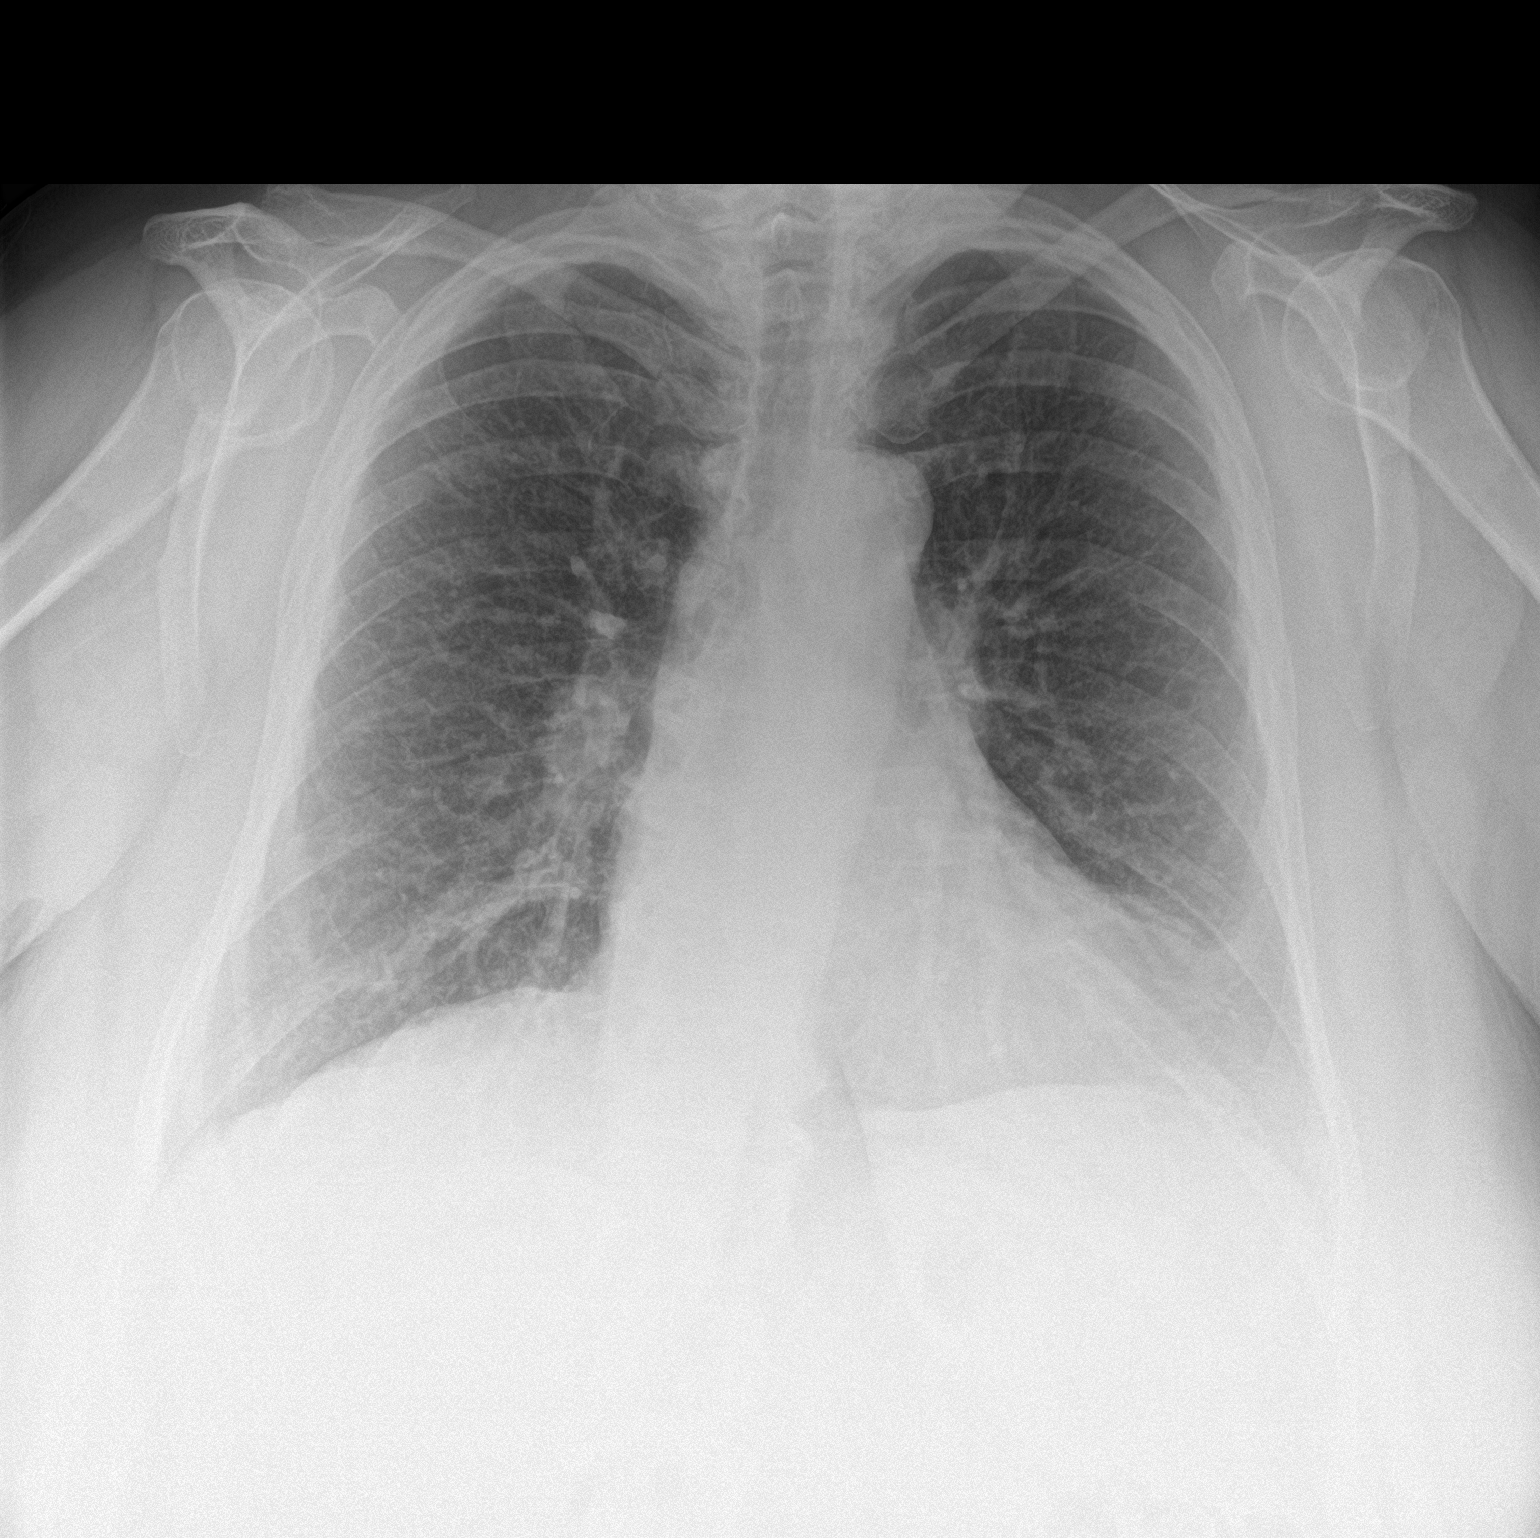

[chest lat]
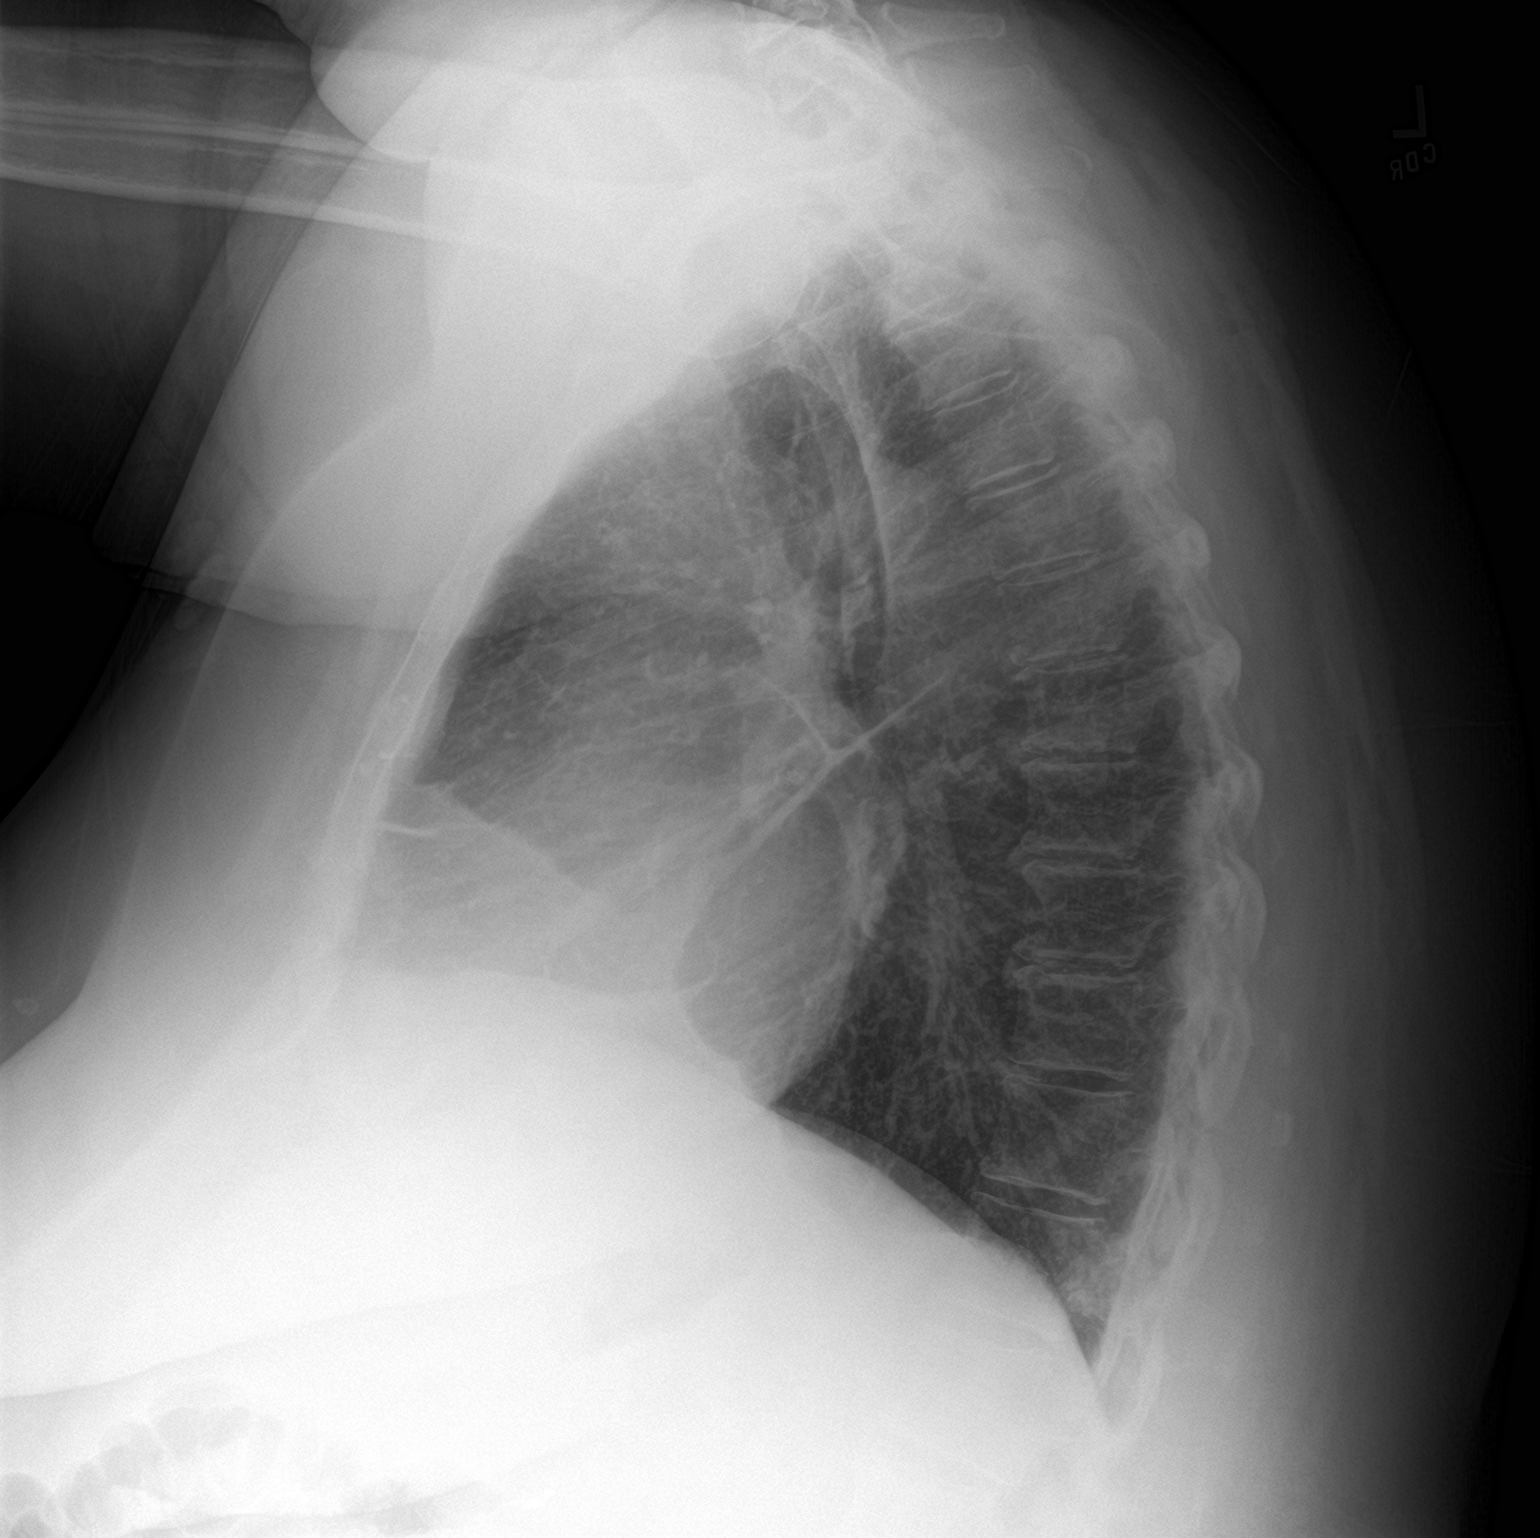

[2 of 2 positions shown; findings below may reference images not displayed]

FINDINGS: Cardiac shadow is within normal limits. Aortic calcifications are
again seen. The lungs are well aerated bilaterally. No focal
infiltrate or sizable effusion is seen. No bony abnormality is seen.
IMPRESSION: No active cardiopulmonary disease.

## 2024-01-23 ENCOUNTER — Other Ambulatory Visit (HOSPITAL_BASED_OUTPATIENT_CLINIC_OR_DEPARTMENT_OTHER): Payer: Self-pay | Admitting: Student

## 2024-01-23 ENCOUNTER — Ambulatory Visit (INDEPENDENT_AMBULATORY_CARE_PROVIDER_SITE_OTHER)
Admission: RE | Admit: 2024-01-23 | Discharge: 2024-01-23 | Disposition: A | Discharge status: Home / Self Care | Payer: Self-pay | Source: Ambulatory Visit | Attending: Student | Admitting: Student

## 2024-01-23 ENCOUNTER — Ambulatory Visit (HOSPITAL_BASED_OUTPATIENT_CLINIC_OR_DEPARTMENT_OTHER)
Admission: RE | Admit: 2024-01-23 | Discharge: 2024-01-23 | Disposition: A | Discharge status: Home / Self Care | Source: Ambulatory Visit | Attending: Student | Admitting: Student

## 2024-01-23 DIAGNOSIS — N8111 Cystocele, midline: Secondary | ICD-10-CM | POA: Diagnosis not present

## 2024-01-23 DIAGNOSIS — R339 Retention of urine, unspecified: Secondary | ICD-10-CM

## 2024-02-09 NOTE — Telephone Encounter (Signed)
 The patient is requesting the prescription be send to [facility] for the antiinflammatory.

## 2024-02-09 NOTE — Telephone Encounter (Signed)
 Patient is requesting a medication called in for her pain until her appointment on 10/31 before her surgery due to the pain worsening. Ashley Bowers// [phone] ([place])  Pharmacy.

## 2024-02-10 ENCOUNTER — Encounter (HOSPITAL_BASED_OUTPATIENT_CLINIC_OR_DEPARTMENT_OTHER): Payer: Self-pay | Admitting: Emergency Medicine

## 2024-02-10 ENCOUNTER — Emergency Department (HOSPITAL_BASED_OUTPATIENT_CLINIC_OR_DEPARTMENT_OTHER)
Admission: EM | Admit: 2024-02-10 | Discharge: 2024-02-11 | Disposition: A | Discharge status: Home / Self Care | Attending: Emergency Medicine | Admitting: Emergency Medicine

## 2024-02-10 ENCOUNTER — Other Ambulatory Visit: Payer: Self-pay

## 2024-02-10 DIAGNOSIS — M25511 Pain in right shoulder: Secondary | ICD-10-CM | POA: Insufficient documentation

## 2024-02-10 DIAGNOSIS — Z79899 Other long term (current) drug therapy: Secondary | ICD-10-CM | POA: Insufficient documentation

## 2024-02-10 DIAGNOSIS — E861 Hypovolemia: Secondary | ICD-10-CM | POA: Insufficient documentation

## 2024-02-10 DIAGNOSIS — Z7982 Long term (current) use of aspirin: Secondary | ICD-10-CM | POA: Diagnosis not present

## 2024-02-10 DIAGNOSIS — G8929 Other chronic pain: Secondary | ICD-10-CM | POA: Diagnosis not present

## 2024-02-10 DIAGNOSIS — E871 Hypo-osmolality and hyponatremia: Secondary | ICD-10-CM | POA: Insufficient documentation

## 2024-02-10 LAB — COMPREHENSIVE METABOLIC PANEL WITH GFR
ALT: 28 U/L (ref 0–44)
AST: 29 U/L (ref 15–41)
Albumin: 3.9 g/dL (ref 3.5–5.0)
Alkaline Phosphatase: 57 U/L (ref 38–126)
Anion gap: 16 — ABNORMAL HIGH (ref 5–15)
BUN: 17 mg/dL (ref 8–23)
CO2: 22 mmol/L (ref 22–32)
Calcium: 8.7 mg/dL — ABNORMAL LOW (ref 8.9–10.3)
Chloride: 92 mmol/L — ABNORMAL LOW (ref 98–111)
Creatinine, Ser: 1.07 mg/dL — ABNORMAL HIGH (ref 0.44–1.00)
GFR, Estimated: 59 mL/min — ABNORMAL LOW (ref >60)
Glucose, Bld: 95 mg/dL (ref 70–99)
Potassium: 3.5 mmol/L (ref 3.5–5.1)
Sodium: 130 mmol/L — ABNORMAL LOW (ref 135–145)
Total Bilirubin: 0.2 mg/dL (ref 0.0–1.2)
Total Protein: 6.1 g/dL — ABNORMAL LOW (ref 6.5–8.1)

## 2024-02-10 LAB — CBC
HCT: 37.4 % (ref 36.0–46.0)
Hemoglobin: 13.1 g/dL (ref 12.0–15.0)
MCH: 32 pg (ref 26.0–34.0)
MCHC: 35 g/dL (ref 30.0–36.0)
MCV: 91.2 fL (ref 80.0–100.0)
Platelets: 222 K/uL (ref 150–400)
RBC: 4.1 MIL/uL (ref 3.87–5.11)
RDW: 12.3 % (ref 11.5–15.5)
WBC: 8.9 K/uL (ref 4.0–10.5)
nRBC: 0 % (ref 0.0–0.2)

## 2024-02-10 LAB — CBG MONITORING, ED: Glucose-Capillary: 88 mg/dL (ref 70–99)

## 2024-02-10 MED ORDER — FENTANYL CITRATE PF 50 MCG/ML IJ SOSY
50.0000 ug | PREFILLED_SYRINGE | Freq: Once | INTRAMUSCULAR | Status: AC
  Administered 2024-02-10: 50 ug via INTRAVENOUS
  Filled 2024-02-10: qty 1

## 2024-02-10 MED ORDER — SODIUM CHLORIDE 0.9 % IV BOLUS
1000.0000 mL | Freq: Once | INTRAVENOUS | Status: AC
Start: 2024-02-10 — End: 2024-02-11
  Administered 2024-02-10: 1000 mL via INTRAVENOUS

## 2024-02-10 NOTE — ED Triage Notes (Signed)
 Pt due to shoulder surgery 03/04/24.  C/o R shoulder pain, poor po intake due to pain.

## 2024-02-10 NOTE — ED Provider Notes (Signed)
 [facility] Provider Note   CSN: [id number] Arrival date & time: 02/10/24  2150     Patient presents with: Shoulder Pain   Ashley Bowers is a 63 y.o. female.  {Add pertinent medical, surgical, social history, OB history to YEP:67052} The history is provided by the patient.  Shoulder Pain  She has history of hypertension, hyperlipidemia and has been having right shoulder pain for several months which is getting worse.  She is scheduled for arthroscopic surgery in 3 weeks.  However, she is not eating and drinking because of the pain.  She states she has not eaten anything for the last 2 days.  She has been taking Goody powder and celecoxib without relief.  She is feeling generally weak.    Prior to Admission medications   Medication Sig Start Date End Date Taking? Authorizing Provider  albuterol (VENTOLIN HFA) 108 (90 Base) MCG/ACT inhaler  11/25/19   [provider]  amLODipine (NORVASC) 10 MG tablet  02/10/19   [provider]  amLODipine-benazepril (LOTREL) 10-20 MG per capsule Take 1 capsule by mouth daily.    [provider]  aspirin  EC 81 MG EC tablet Take 1 tablet (81 mg total) by mouth daily. Patient not taking: Reported on 03/02/2020 07/03/13   Davia Nydia POUR, MD  benazepril (LOTENSIN) 20 MG tablet  02/10/19   [provider]  busPIRone (BUSPAR) 10 MG tablet Take 10 mg by mouth 2 (two) times daily. 02/24/20   [provider]  cloNIDine (CATAPRES) 0.2 MG tablet Take 0.2 mg by mouth 2 (two) times daily. 12/01/19   [provider]  diazepam  (VALIUM ) 5 MG tablet Take 40 to 60 minutes prior to MRI 03/27/20   Skeet Cornet R, DO  hydrochlorothiazide (HYDRODIURIL) 25 MG tablet Take 25 mg by mouth daily.  04/20/13   [provider]  ketoconazole  (NIZORAL ) 2 % cream Apply 1 fingertip amount to each foot daily. Patient not taking: Reported on 03/02/2020 02/15/19   Gretel Ozell PARAS, DPM   montelukast (SINGULAIR) 10 MG tablet Take 10 mg by mouth daily. 01/26/20   [provider]  neomycin -polymyxin-hydrocortisone (CORTISPORIN) OTIC solution Apply 2 drops to the ingrown toenail site twice daily. Cover with band-aid. Patient not taking: Reported on 03/02/2020 02/15/19   Gretel Ozell PARAS, DPM  nortriptyline  (PAMELOR ) 10 MG capsule TAKE 1 CAPSULE BY MOUTH AT  BEDTIME 12/29/20   Skeet Cornet SAUNDERS, DO  omeprazole  (PRILOSEC) 40 MG capsule Take 40 mg by mouth daily. 02/19/20   [provider]  potassium chloride  SA (KLOR-CON ) 20 MEQ tablet Take 1 tablet (20 mEq total) by mouth daily. 11/29/19   Steinl, Kevin, MD  rosuvastatin (CRESTOR) 10 MG tablet  02/10/19   [provider]    Allergies: Penicillins, Morphine and codeine, and Vicodin [hydrocodone-acetaminophen ]    Review of Systems  All other systems reviewed and are negative.   Updated Vital Signs BP (!) 84/64   Pulse 73   Temp 97.6 F (36.4 C) (Oral)   Resp 19   Ht 5' 8 (1.727 m)   Wt 97.5 kg   SpO2 95%   BMI 32.69 kg/m   Physical Exam Vitals and nursing note reviewed.   63 year old female, resting comfortably and in no acute distress. Vital signs are significant for low blood pressure. Oxygen saturation is 95%, which is normal. Head is normocephalic and atraumatic. PERRLA, EOMI.  Neck is nontender and supple without adenopathy. Lungs have  diffuse expiratory wheezes without rales or rhonchi. Chest is nontender. Heart has regular rate and rhythm without murmur. Abdomen is soft, flat, nontender. Extremities: There is no cyanosis or edema, there is tenderness to palpation of the right shoulder and pain with passive range of motion. Skin is warm and dry without rash. Neurologic: Mental status is normal, cranial nerves are intact, moves all extremities equally.  (all labs ordered are listed, but only abnormal results are displayed) Labs Reviewed  CBC  COMPREHENSIVE METABOLIC PANEL WITH GFR   URINALYSIS, ROUTINE W REFLEX MICROSCOPIC  CBG MONITORING, ED    EKG: None  Radiology: No results found.  {Document cardiac monitor, telemetry assessment procedure when appropriate:32947} Procedures   Medications Ordered in the ED - No data to display    {Click here for ABCD2, HEART and other calculators REFRESH Note before signing:1}                              Medical Decision Making Amount and/or Complexity of Data Reviewed Labs: ordered.   Hypotension in the setting of poor oral intake.  This a presentation with wide range of treatment options and carries with it a high risk of morbidity and complications.  Differential diagnosis includes, but is not limited to, dehydration, occult ACS, sepsis.  No evidence of active infection.  I have ordered IV fluids.  I have reviewed her laboratory tests, my interpretation is normal CBC, mild hyponatremia which is not felt to be clinically significant, borderline renal insufficiency but with an elevated anion gap.  {Document critical care time when appropriate  Document review of labs and clinical decision tools ie CHADS2VASC2, etc  Document your independent review of radiology images and any outside records  Document your discussion with family members, caretakers and with consultants  Document social determinants of health affecting pt's care  Document your decision making why or why not admission, treatments were needed:32947:::1}   Final diagnoses:  None    ED Discharge Orders     None

## 2024-02-11 LAB — URINALYSIS, ROUTINE W REFLEX MICROSCOPIC
Bilirubin Urine: NEGATIVE
Glucose, UA: NEGATIVE mg/dL
Hgb urine dipstick: NEGATIVE
Ketones, ur: NEGATIVE mg/dL
Leukocytes,Ua: NEGATIVE
Nitrite: NEGATIVE
Protein, ur: NEGATIVE mg/dL
Specific Gravity, Urine: 1.01 (ref 1.005–1.030)
pH: 5.5 (ref 5.0–8.0)

## 2024-02-11 MED ORDER — OXYCODONE-ACETAMINOPHEN 5-325 MG PO TABS
1.0000 | ORAL_TABLET | Freq: Once | ORAL | Status: AC
  Administered 2024-02-11: 1 via ORAL
  Filled 2024-02-11: qty 1

## 2024-02-11 MED ORDER — ONDANSETRON HCL 4 MG/2ML IJ SOLN
4.0000 mg | Freq: Once | INTRAMUSCULAR | Status: AC
  Administered 2024-02-11: 4 mg via INTRAVENOUS
  Filled 2024-02-11: qty 2

## 2024-02-11 MED ORDER — SODIUM CHLORIDE 0.9 % IV BOLUS
1000.0000 mL | Freq: Once | INTRAVENOUS | Status: AC
  Administered 2024-02-11: 1000 mL via INTRAVENOUS

## 2024-02-11 MED ORDER — OXYCODONE HCL 5 MG PO TABS: 5.0000 mg | ORAL_TABLET | ORAL | 0 refills | Status: AC | PRN

## 2024-02-11 MED ORDER — LACTATED RINGERS IV BOLUS
1000.0000 mL | Freq: Once | INTRAVENOUS | Status: AC
  Administered 2024-02-11: 1000 mL via INTRAVENOUS

## 2024-02-11 MED ORDER — HYDROMORPHONE HCL 1 MG/ML IJ SOLN
0.5000 mg | Freq: Once | INTRAMUSCULAR | Status: AC
  Administered 2024-02-11: 0.5 mg via INTRAVENOUS
  Filled 2024-02-11: qty 1

## 2024-02-11 NOTE — Discharge Instructions (Addendum)
 Drink plenty of fluids.  Applying ice to your shoulder should help with pain control.  Ice can be applied for 30 minutes at a time, 4 times a day.  You may take acetaminophen  and/or ibuprofen  as needed for pain.  Please note that if you combine acetaminophen  and ibuprofen , you will get better pain relief and you can get from taking either medication by itself.  If combining acetaminophen  and ibuprofen  is still not giving you adequate pain relief, then also add oxycodone .  Return to the emergency department if symptoms are getting worse.
